# Patient Record
Sex: Female | Born: 1950 | Race: White | Hispanic: No | Marital: Married | Smoking: Former smoker
Health system: Southern US, Community
[De-identification: ages and names within clinical notes are randomized; demographics above are authoritative.]

## PROBLEM LIST (undated history)

## (undated) DIAGNOSIS — M8580 Other specified disorders of bone density and structure, unspecified site: Secondary | ICD-10-CM

## (undated) DIAGNOSIS — D069 Carcinoma in situ of cervix, unspecified: Secondary | ICD-10-CM

## (undated) DIAGNOSIS — I10 Essential (primary) hypertension: Secondary | ICD-10-CM

## (undated) DIAGNOSIS — C801 Malignant (primary) neoplasm, unspecified: Secondary | ICD-10-CM

## (undated) DIAGNOSIS — I6529 Occlusion and stenosis of unspecified carotid artery: Secondary | ICD-10-CM

## (undated) DIAGNOSIS — J449 Chronic obstructive pulmonary disease, unspecified: Secondary | ICD-10-CM

## (undated) DIAGNOSIS — K219 Gastro-esophageal reflux disease without esophagitis: Secondary | ICD-10-CM

## (undated) DIAGNOSIS — Z7982 Long term (current) use of aspirin: Secondary | ICD-10-CM

## (undated) DIAGNOSIS — Z7902 Long term (current) use of antithrombotics/antiplatelets: Secondary | ICD-10-CM

## (undated) DIAGNOSIS — I5189 Other ill-defined heart diseases: Secondary | ICD-10-CM

## (undated) DIAGNOSIS — F419 Anxiety disorder, unspecified: Secondary | ICD-10-CM

## (undated) DIAGNOSIS — I2510 Atherosclerotic heart disease of native coronary artery without angina pectoris: Secondary | ICD-10-CM

## (undated) DIAGNOSIS — D509 Iron deficiency anemia, unspecified: Secondary | ICD-10-CM

## (undated) DIAGNOSIS — K635 Polyp of colon: Secondary | ICD-10-CM

## (undated) DIAGNOSIS — E785 Hyperlipidemia, unspecified: Secondary | ICD-10-CM

## (undated) DIAGNOSIS — J431 Panlobular emphysema: Secondary | ICD-10-CM

## (undated) DIAGNOSIS — E538 Deficiency of other specified B group vitamins: Secondary | ICD-10-CM

## (undated) DIAGNOSIS — Z720 Tobacco use: Secondary | ICD-10-CM

## (undated) DIAGNOSIS — I779 Disorder of arteries and arterioles, unspecified: Secondary | ICD-10-CM

## (undated) DIAGNOSIS — E042 Nontoxic multinodular goiter: Secondary | ICD-10-CM

## (undated) DIAGNOSIS — C55 Malignant neoplasm of uterus, part unspecified: Secondary | ICD-10-CM

## (undated) DIAGNOSIS — I6522 Occlusion and stenosis of left carotid artery: Secondary | ICD-10-CM

## (undated) DIAGNOSIS — E559 Vitamin D deficiency, unspecified: Secondary | ICD-10-CM

## (undated) DIAGNOSIS — K760 Fatty (change of) liver, not elsewhere classified: Secondary | ICD-10-CM

## (undated) HISTORY — PX: ABDOMINAL HYSTERECTOMY: SHX81

---

## 1978-11-01 DIAGNOSIS — C801 Malignant (primary) neoplasm, unspecified: Secondary | ICD-10-CM

## 1978-11-01 HISTORY — DX: Malignant (primary) neoplasm, unspecified: C80.1

## 1989-03-02 HISTORY — PX: LAPAROSCOPIC OVARIAN: SHX5906

## 2005-12-02 ENCOUNTER — Ambulatory Visit: Payer: Self-pay | Admitting: Internal Medicine

## 2006-12-16 ENCOUNTER — Ambulatory Visit: Payer: Self-pay | Admitting: Internal Medicine

## 2007-07-08 ENCOUNTER — Ambulatory Visit: Payer: Self-pay | Admitting: Gastroenterology

## 2007-12-19 ENCOUNTER — Ambulatory Visit: Payer: Self-pay | Admitting: Internal Medicine

## 2009-01-03 ENCOUNTER — Ambulatory Visit: Payer: Self-pay | Admitting: Internal Medicine

## 2010-01-07 ENCOUNTER — Ambulatory Visit: Payer: Self-pay | Admitting: Internal Medicine

## 2011-01-15 ENCOUNTER — Ambulatory Visit: Payer: Self-pay | Admitting: Internal Medicine

## 2011-01-20 ENCOUNTER — Ambulatory Visit: Payer: Self-pay | Admitting: Internal Medicine

## 2012-01-26 ENCOUNTER — Ambulatory Visit: Payer: Self-pay | Admitting: Internal Medicine

## 2012-01-29 ENCOUNTER — Ambulatory Visit: Payer: Self-pay | Admitting: Internal Medicine

## 2013-02-01 ENCOUNTER — Ambulatory Visit: Payer: Self-pay | Admitting: Internal Medicine

## 2014-01-16 ENCOUNTER — Ambulatory Visit: Payer: Self-pay | Admitting: Internal Medicine

## 2014-07-09 ENCOUNTER — Other Ambulatory Visit: Payer: Self-pay

## 2014-07-09 DIAGNOSIS — Z1231 Encounter for screening mammogram for malignant neoplasm of breast: Secondary | ICD-10-CM

## 2015-01-30 ENCOUNTER — Ambulatory Visit
Admission: RE | Admit: 2015-01-30 | Discharge: 2015-01-30 | Disposition: A | Discharge status: Home / Self Care | Payer: 59 | Source: Ambulatory Visit | Attending: Internal Medicine | Admitting: Internal Medicine

## 2015-01-30 DIAGNOSIS — Z1231 Encounter for screening mammogram for malignant neoplasm of breast: Secondary | ICD-10-CM | POA: Diagnosis not present

## 2015-01-30 HISTORY — DX: Malignant (primary) neoplasm, unspecified: C80.1

## 2015-09-26 DIAGNOSIS — Z86010 Personal history of colonic polyps: Secondary | ICD-10-CM | POA: Insufficient documentation

## 2015-10-02 ENCOUNTER — Other Ambulatory Visit: Payer: Self-pay | Admitting: Internal Medicine

## 2015-10-02 DIAGNOSIS — Z1231 Encounter for screening mammogram for malignant neoplasm of breast: Secondary | ICD-10-CM

## 2016-02-04 ENCOUNTER — Ambulatory Visit: Payer: 59

## 2016-02-04 ENCOUNTER — Ambulatory Visit
Admission: RE | Admit: 2016-02-04 | Discharge: 2016-02-04 | Disposition: A | Discharge status: Home / Self Care | Payer: 59 | Source: Ambulatory Visit | Attending: Internal Medicine | Admitting: Internal Medicine

## 2016-02-04 DIAGNOSIS — Z1231 Encounter for screening mammogram for malignant neoplasm of breast: Secondary | ICD-10-CM | POA: Insufficient documentation

## 2016-02-20 DIAGNOSIS — R072 Precordial pain: Secondary | ICD-10-CM | POA: Insufficient documentation

## 2017-03-15 ENCOUNTER — Other Ambulatory Visit: Payer: Self-pay | Admitting: Internal Medicine

## 2017-03-15 DIAGNOSIS — Z1231 Encounter for screening mammogram for malignant neoplasm of breast: Secondary | ICD-10-CM

## 2017-04-29 ENCOUNTER — Ambulatory Visit
Admission: RE | Admit: 2017-04-29 | Discharge: 2017-04-29 | Disposition: A | Discharge status: Home / Self Care | Payer: 59 | Source: Ambulatory Visit | Attending: Internal Medicine | Admitting: Internal Medicine

## 2017-04-29 DIAGNOSIS — Z1231 Encounter for screening mammogram for malignant neoplasm of breast: Secondary | ICD-10-CM | POA: Diagnosis present

## 2017-09-10 ENCOUNTER — Other Ambulatory Visit: Payer: Self-pay

## 2017-09-10 ENCOUNTER — Emergency Department (HOSPITAL_COMMUNITY)
Admission: EM | Admit: 2017-09-10 | Discharge: 2017-09-11 | Disposition: A | Discharge status: Home / Self Care | Payer: 59 | Attending: Emergency Medicine | Admitting: Emergency Medicine

## 2017-09-10 ENCOUNTER — Encounter (HOSPITAL_COMMUNITY): Payer: Self-pay | Admitting: Emergency Medicine

## 2017-09-10 DIAGNOSIS — I10 Essential (primary) hypertension: Secondary | ICD-10-CM | POA: Diagnosis not present

## 2017-09-10 DIAGNOSIS — R1013 Epigastric pain: Secondary | ICD-10-CM | POA: Diagnosis present

## 2017-09-10 DIAGNOSIS — Z79899 Other long term (current) drug therapy: Secondary | ICD-10-CM | POA: Diagnosis not present

## 2017-09-10 DIAGNOSIS — F17210 Nicotine dependence, cigarettes, uncomplicated: Secondary | ICD-10-CM | POA: Insufficient documentation

## 2017-09-10 DIAGNOSIS — Z8542 Personal history of malignant neoplasm of other parts of uterus: Secondary | ICD-10-CM | POA: Insufficient documentation

## 2017-09-10 HISTORY — DX: Essential (primary) hypertension: I10

## 2017-09-10 HISTORY — DX: Anxiety disorder, unspecified: F41.9

## 2017-09-10 HISTORY — DX: Gastro-esophageal reflux disease without esophagitis: K21.9

## 2017-09-10 LAB — CBC WITH DIFFERENTIAL/PLATELET
Basophils Absolute: 0 10*3/uL (ref 0.0–0.1)
Basophils Relative: 0 %
EOS ABS: 0.1 10*3/uL (ref 0.0–0.7)
EOS PCT: 0 %
HEMATOCRIT: 40.7 % (ref 36.0–46.0)
Hemoglobin: 14 g/dL (ref 12.0–15.0)
LYMPHS ABS: 3.8 10*3/uL (ref 0.7–4.0)
Lymphocytes Relative: 28 %
MCH: 31.3 pg (ref 26.0–34.0)
MCHC: 34.4 g/dL (ref 30.0–36.0)
MCV: 91.1 fL (ref 78.0–100.0)
MONO ABS: 0.7 10*3/uL (ref 0.1–1.0)
MONOS PCT: 5 %
Neutro Abs: 9 10*3/uL — ABNORMAL HIGH (ref 1.7–7.7)
Neutrophils Relative %: 67 %
Platelets: 222 10*3/uL (ref 150–400)
RBC: 4.47 MIL/uL (ref 3.87–5.11)
RDW: 12.8 % (ref 11.5–15.5)
WBC: 13.6 10*3/uL — AB (ref 4.0–10.5)

## 2017-09-10 LAB — COMPREHENSIVE METABOLIC PANEL
ALT: 18 U/L (ref 0–44)
AST: 24 U/L (ref 15–41)
Albumin: 3.8 g/dL (ref 3.5–5.0)
Alkaline Phosphatase: 60 U/L (ref 38–126)
Anion gap: 10 (ref 5–15)
BUN: 12 mg/dL (ref 8–23)
CHLORIDE: 100 mmol/L (ref 98–111)
CO2: 24 mmol/L (ref 22–32)
CREATININE: 0.89 mg/dL (ref 0.44–1.00)
Calcium: 8.9 mg/dL (ref 8.9–10.3)
GFR calc Af Amer: >60 mL/min (ref >60)
GLUCOSE: 162 mg/dL — AB (ref 70–99)
Potassium: 3.6 mmol/L (ref 3.5–5.1)
Sodium: 134 mmol/L — ABNORMAL LOW (ref 135–145)
Total Bilirubin: 0.5 mg/dL (ref 0.3–1.2)
Total Protein: 6.9 g/dL (ref 6.5–8.1)

## 2017-09-10 LAB — LIPASE, BLOOD: Lipase: 27 U/L (ref 11–51)

## 2017-09-10 NOTE — ED Triage Notes (Signed)
Pt c/o upper bilateral abd pain since 1800 today, denies n/v/d, last BM 09/10/17

## 2017-09-11 NOTE — Discharge Instructions (Addendum)

## 2017-09-11 NOTE — ED Notes (Signed)
Water and Ginger Ale provided

## 2017-09-11 NOTE — ED Provider Notes (Signed)
[facility] EMERGENCY DEPARTMENT Provider Note   CSN: [id number] Arrival date & time: 09/10/17  2234     History   Chief Complaint Chief Complaint  Patient presents with  . Abdominal Pain    Pt seen with NP student, I performed history/physical/documentation HPI Kaitlin Miller is a 67 y.o. female.  The history is provided by the patient and the spouse.  Abdominal Pain   This is a new problem. The current episode started 6 to 12 hours ago. The problem occurs constantly. The problem has been gradually improving. The pain is located in the epigastric region. The pain is moderate. Pertinent negatives include fever, diarrhea, hematochezia, melena and vomiting. The symptoms are aggravated by palpation. Nothing relieves the symptoms.  Patient presents with abdominal pain.  She reports gradual onset of upper abdominal pain and then started to radiate throughout abdomen.  It is now improving.  Denies nausea/vomiting/diarrhea.  She has never had this. No history of recent GI bleed.  She does not use NSAIDs. He has otherwise been well  Past Medical History:  Diagnosis Date  . Acid reflux   . Anxiety   . 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 Hypertension     There are no active problems to display for this patient.   Past Surgical History:  Procedure Laterality Date  . LAPAROSCOPIC OVARIAN  1991     OB History   None      Home Medications    Prior to Admission medications   Medication Sig Start Date End Date Taking? Authorizing Provider  bisoprolol-hydrochlorothiazide (ZIAC) 5-6.25 MG tablet Take 1 tablet by mouth daily.   Yes [provider]  Cholecalciferol (VITAMIN D) 2000 units CAPS Take 1 capsule by mouth every morning.   Yes [provider]  omeprazole (PRILOSEC) 20 MG capsule Take 20 mg by mouth every morning.   Yes [provider]  phentermine (ADIPEX-P) 37.5 MG tablet Take 37.5 mg by mouth daily before breakfast.   Yes [provider]  venlafaxine XR (EFFEXOR-XR) 150 MG 24 hr capsule Take 150 mg by mouth daily with breakfast.   Yes [provider]    Family History Family History  Problem Relation Age of Onset  . Breast cancer Sister 70    Social History Social History   Tobacco Use  . Smoking status: Current Every Day Smoker    Packs/day: 1.00    Years: 25.00    Pack years: 25.00    Types: Cigarettes  . Smokeless tobacco: Never Used  Substance Use Topics  . Alcohol use: Not Currently  . Drug use: Not Currently     Allergies   Patient has no known allergies.   Review of Systems Review of Systems  Constitutional: Negative for fever.  Respiratory: Negative for shortness of breath.   Cardiovascular: Negative for chest pain.  Gastrointestinal: Positive for abdominal pain. Negative for blood in stool, diarrhea, hematochezia, melena and vomiting.  All other systems reviewed and are negative.    Physical Exam Updated Vital Signs BP (!) 156/71 (BP Location: Right Arm)   Pulse 65   Temp 97.7 F (36.5 C) (Oral)   Resp 14   Ht 1.524 m (5')   Wt 63.5 kg (140 lb)   SpO2 95%   BMI 27.34 kg/m   Physical Exam  CONSTITUTIONAL: Well developed/well nourished HEAD: Normocephalic/atraumatic EYES: EOMI/PERRL, no icterus ENMT: Mucous membranes moist NECK: supple no meningeal signs SPINE/BACK:entire spine nontender CV: S1/S2 noted, no murmurs/rubs/gallops  noted LUNGS: Lungs are clear to auscultation bilaterally, no apparent distress ABDOMEN: soft, nontender, no rebound or guarding, bowel sounds noted throughout abdomen GU:no cva tenderness NEURO: Pt is awake/alert/appropriate, moves all extremitiesx4.  No facial droop.   EXTREMITIES: pulses normal/equal, full ROM SKIN: warm, color normal PSYCH: no abnormalities of mood noted, alert and oriented to situation  ED Treatments / Results  Labs (all labs ordered are listed, but only abnormal results are displayed) Labs Reviewed  COMPREHENSIVE  METABOLIC PANEL - Abnormal; Notable for the following components:      Result Value   Sodium 134 (*)    Glucose, Bld 162 (*)    All other components within normal limits  CBC WITH DIFFERENTIAL/PLATELET - Abnormal; Notable for the following components:   WBC 13.6 (*)    Neutro Abs 9.0 (*)    All other components within normal limits  LIPASE, BLOOD    EKG EKG Interpretation  Date/Time:  Saturday September 11 2017 00:10:54 EDT Ventricular Rate:  67 PR Interval:    QRS Duration: 97 QT Interval:  411 QTC Calculation: 434 R Axis:   61 Text Interpretation:  Sinus rhythm Anteroseptal infarct, old No previous ECGs available Confirmed by Ripley Fraise [phone]) on 09/11/2017 12:18:31 AM   Radiology No results found.  Procedures Procedures   Medications Ordered in ED Medications - No data to display   Initial Impression / Assessment and Plan / ED Course  I have reviewed the triage vital signs and the nursing notes.  Pertinent labs  results that were available during my care of the patient were reviewed by me and considered in my medical decision making (see chart for details).     Patient presented for epigastric abdominal pain that was improving by the time of our evaluation.  She continued to improve the emergency department without any medications.  She was asked multiple times and she declined pain meds.  She was taking p.o. fluids.  She reported very mild pain on final evaluation. I gave the patient the option of further monitoring, next day ultrasound for cholelithiasis, or just follow up with PCP next week.  She elected to follow-up with her PCP next week which seems reasonable.  She had an elevated white count, but no other concerning findings.  EKG was unremarkable She is awake alert, no distress.  Is resting comfortably.  Patient is appropriate for d/c home.  I doubt acute abdominal emergency at this time.  We discussed strict ER return precautions including worsened abdominal  pain, fever >100F with repetitive vomiting over next  12 hours Final Clinical Impressions(s) / ED Diagnoses   Final diagnoses:  Epigastric pain    ED Discharge Orders    None       Ripley Fraise, MD 09/11/17 [phone]

## 2018-04-14 ENCOUNTER — Other Ambulatory Visit: Payer: Self-pay | Admitting: Internal Medicine

## 2018-04-14 DIAGNOSIS — Z1231 Encounter for screening mammogram for malignant neoplasm of breast: Secondary | ICD-10-CM

## 2018-05-11 ENCOUNTER — Other Ambulatory Visit: Payer: Self-pay

## 2018-05-11 ENCOUNTER — Ambulatory Visit
Admission: RE | Admit: 2018-05-11 | Discharge: 2018-05-11 | Disposition: A | Discharge status: Home / Self Care | Payer: 59 | Source: Ambulatory Visit | Attending: Internal Medicine | Admitting: Internal Medicine

## 2018-05-11 DIAGNOSIS — Z1231 Encounter for screening mammogram for malignant neoplasm of breast: Secondary | ICD-10-CM | POA: Insufficient documentation

## 2018-11-08 ENCOUNTER — Encounter (INDEPENDENT_AMBULATORY_CARE_PROVIDER_SITE_OTHER): Payer: Self-pay

## 2018-11-08 ENCOUNTER — Ambulatory Visit (INDEPENDENT_AMBULATORY_CARE_PROVIDER_SITE_OTHER): Payer: 59 | Admitting: Vascular Surgery

## 2018-11-08 ENCOUNTER — Other Ambulatory Visit: Payer: Self-pay

## 2018-11-08 ENCOUNTER — Encounter (INDEPENDENT_AMBULATORY_CARE_PROVIDER_SITE_OTHER): Payer: Self-pay | Admitting: Vascular Surgery

## 2018-11-08 VITALS — BP 158/81 | HR 84 | Resp 16 | Ht 60.0 in | Wt 140.0 lb

## 2018-11-08 DIAGNOSIS — E042 Nontoxic multinodular goiter: Secondary | ICD-10-CM | POA: Insufficient documentation

## 2018-11-08 DIAGNOSIS — K219 Gastro-esophageal reflux disease without esophagitis: Secondary | ICD-10-CM | POA: Diagnosis not present

## 2018-11-08 DIAGNOSIS — E782 Mixed hyperlipidemia: Secondary | ICD-10-CM | POA: Insufficient documentation

## 2018-11-08 DIAGNOSIS — M8580 Other specified disorders of bone density and structure, unspecified site: Secondary | ICD-10-CM | POA: Insufficient documentation

## 2018-11-08 DIAGNOSIS — E559 Vitamin D deficiency, unspecified: Secondary | ICD-10-CM | POA: Insufficient documentation

## 2018-11-08 DIAGNOSIS — I6523 Occlusion and stenosis of bilateral carotid arteries: Secondary | ICD-10-CM | POA: Diagnosis not present

## 2018-11-08 DIAGNOSIS — I6529 Occlusion and stenosis of unspecified carotid artery: Secondary | ICD-10-CM | POA: Insufficient documentation

## 2018-11-08 DIAGNOSIS — Z87828 Personal history of other (healed) physical injury and trauma: Secondary | ICD-10-CM | POA: Insufficient documentation

## 2018-11-08 DIAGNOSIS — I10 Essential (primary) hypertension: Secondary | ICD-10-CM

## 2018-11-08 DIAGNOSIS — J431 Panlobular emphysema: Secondary | ICD-10-CM | POA: Insufficient documentation

## 2018-11-08 DIAGNOSIS — D099 Carcinoma in situ, unspecified: Secondary | ICD-10-CM | POA: Insufficient documentation

## 2018-11-08 NOTE — Assessment & Plan Note (Addendum)
Continue PPI as already ordered, this medication has been reviewed and there are no changes at this time. Avoidence of caffeine and alcohol Moderate elevation of the head of the bed  

## 2018-11-08 NOTE — Assessment & Plan Note (Signed)
Her carotid duplex shows velocities in the upper end of the 1 to 49% range bilaterally with elevated velocities in the left external carotid artery of greater than 60%. We discussed the pathophysiology and natural history of carotid disease today.  The patient has relatively mild internal carotid artery stenosis bilaterally.  Her left external carotid artery stenosis is not clinically significant but may explain the bruit.  I would recommend she do an 81 mg aspirin daily.  We discussed risk factor modification such as blood pressure control and tobacco cessation.  We discussed the low risk of stroke at this level and the reason and rationale for no intervention unless the carotid disease becomes greater than 70%.  At this level of disease, annual follow-up is appropriate so we will see her in 1 year.

## 2018-11-08 NOTE — Progress Notes (Signed)
Patient ID: Kaitlin Miller, female   DOB: 1950-07-29, 68 y.o.   MRN: [id number]  Chief Complaint  Patient presents with  . New Patient (Initial Visit)    ref [facility] for carotid stenosis    HPI Kaitlin Miller is a 68 y.o. female.  I am asked to see the patient by Dr. Doy Hutching for evaluation of a carotid bruit and carotid artery stenosis.  The patient reports no symptoms.  She was in her usual state of health when her primary care physician noted a bruit on his routine physical exam.  Astutely, he ordered a carotid duplex.  The patient had not had any TIA or stroke symptoms. Specifically, the patient denies amaurosis fugax, speech or swallowing difficulties, or arm or leg weakness or numbness.  Her carotid duplex shows velocities in the upper end of the 1 to 49% range bilaterally with elevated velocities in the left external carotid artery of greater than 60%.   Past Medical History:  Diagnosis Date  . Acid reflux   . Anxiety   . 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 Hypertension     Past Surgical History:  Procedure Laterality Date  . LAPAROSCOPIC OVARIAN  1991    Family History  Problem Relation Age of Onset  . Breast cancer Sister 22  No bleeding or clotting disorders No aneurysms or autoimmune diseases  Social History Social History   Tobacco Use  . Smoking status: Current Every Day Smoker    Packs/day: 1.00    Years: 25.00    Pack years: 25.00    Types: Cigarettes  . Smokeless tobacco: Never Used  Substance Use Topics  . Alcohol use: Not Currently  . Drug use: Not Currently  Married and husband accompanies today  No Known Allergies  Current Outpatient Medications  Medication Sig Dispense Refill  . Cholecalciferol (VITAMIN D) 125 MCG (5000 UT) CAPS Take 1 capsule by mouth every morning.     . hydrALAZINE (APRESOLINE) 50 MG tablet TAKE 1.5 TABLETS BY MOUTH 2 TIMES DAILY    . hydrochlorothiazide (HYDRODIURIL) 25 MG tablet Take 25 mg by mouth daily.    Marland Kitchen  omeprazole (PRILOSEC) 20 MG capsule Take 20 mg by mouth every morning.    . potassium chloride SA (K-DUR) 20 MEQ tablet Take by mouth.    . venlafaxine XR (EFFEXOR-XR) 150 MG 24 hr capsule Take 150 mg by mouth daily with breakfast.    . bisoprolol-hydrochlorothiazide (ZIAC) 5-6.25 MG tablet Take 1 tablet by mouth daily.    . phentermine (ADIPEX-P) 37.5 MG tablet Take 37.5 mg by mouth daily before breakfast.     No current facility-administered medications for this visit.       REVIEW OF SYSTEMS (Negative unless checked)  Constitutional: [] Weight loss  [] Fever  [] Chills Cardiac: [] Chest pain   [] Chest pressure   [] Palpitations   [] Shortness of breath when laying flat   [] Shortness of breath at rest   [] Shortness of breath with exertion. Vascular:  [] Pain in legs with walking   [] Pain in legs at rest   [] Pain in legs when laying flat   [] Claudication   [] Pain in feet when walking  [] Pain in feet at rest  [] Pain in feet when laying flat   [] History of DVT   [] Phlebitis   [] Swelling in legs   [] Varicose veins   [] Non-healing ulcers Pulmonary:   [] Uses home oxygen   [] Productive cough   [] Hemoptysis   [] Wheeze  [] COPD   [] Asthma  Neurologic:  [] Dizziness  [] Blackouts   [] Seizures   [] History of stroke   [] History of TIA  [] Aphasia   [] Temporary blindness   [] Dysphagia   [] Weakness or numbness in arms   [] Weakness or numbness in legs Musculoskeletal:  [] Arthritis   [] Joint swelling   [] Joint pain   [] Low back pain Hematologic:  [] Easy bruising  [] Easy bleeding   [] Hypercoagulable state   [] Anemic  [] Hepatitis Gastrointestinal:  [] Blood in stool   [] Vomiting blood  [x] Gastroesophageal reflux/heartburn   [] Abdominal pain Genitourinary:  [] Chronic kidney disease   [] Difficult urination  [] Frequent urination  [] Burning with urination   [] Hematuria Skin:  [] Rashes   [] Ulcers   [] Wounds Psychological:  [x] History of anxiety   []  History of major depression.    Physical Exam BP (!) 158/81 (BP Location:  Right Arm)   Pulse 84   Resp 16   Ht 5' (1.524 m)   Wt 140 lb (63.5 kg)   BMI 27.34 kg/m  Gen:  WD/WN, NAD.  Appears younger than stated age Head: [place]/AT, No temporalis wasting. Ear/Nose/Throat: Hearing grossly intact, nares w/o erythema or drainage, oropharynx w/o Erythema/Exudate Eyes: Conjunctiva clear, sclera non-icteric  Neck: trachea midline.  Left carotid bruit is present Pulmonary:  Good air movement, clear to auscultation bilaterally.  Cardiac: RRR, normal S1, S2 Vascular:  Vessel Right Left  Radial Palpable Palpable                          PT Palpable Palpable  DP Palpable Palpable   Gastrointestinal: soft, non-tender/non-distended.  Musculoskeletal: M/S 5/5 throughout.  Extremities without ischemic changes.  No deformity or atrophy.  No significant lower extremity edema. Neurologic: Sensation grossly intact in extremities.  Symmetrical.  Speech is fluent. Motor exam as listed above. Psychiatric: Judgment intact, Mood & affect appropriate for pt's clinical situation. Dermatologic: No rashes or ulcers noted.  No cellulitis or open wounds.   Radiology No results found.  Labs No results found for this or any previous visit (from the past 2160 hour(s)).  Assessment/Plan:  Benign essential hypertension blood pressure control important in reducing the progression of atherosclerotic disease. On appropriate oral medications.   GERD (gastroesophageal reflux disease) Continue PPI as already ordered, this medication has been reviewed and there are no changes at this time. Avoidence of caffeine and alcohol Moderate elevation of the head of the bed   Carotid stenosis Her carotid duplex shows velocities in the upper end of the 1 to 49% range bilaterally with elevated velocities in the left external carotid artery of greater than 60%. We discussed the pathophysiology and natural history of carotid disease today.  The patient has relatively mild internal carotid artery  stenosis bilaterally.  Her left external carotid artery stenosis is not clinically significant but may explain the bruit.  I would recommend she do an 81 mg aspirin daily.  We discussed risk factor modification such as blood pressure control and tobacco cessation.  We discussed the low risk of stroke at this level and the reason and rationale for no intervention unless the carotid disease becomes greater than 70%.  At this level of disease, annual follow-up is appropriate so we will see her in 1 year.      Leotis Pain 11/08/2018, 4:41 PM   This note was created with Dragon medical transcription system.  Any errors from dictation are unintentional.

## 2018-11-08 NOTE — Patient Instructions (Signed)
Carotid Artery Disease  The carotid arteries are arteries on both sides of the neck. They carry blood to the brain, face, and neck. Carotid artery disease happens when these arteries become smaller (narrow) or get blocked. If these arteries become smaller or get blocked, you are more likely to have a stroke or a warning stroke (transient ischemic attack). Follow these instructions at home:  Take over-the-counter and prescription medicines only as told by your doctor.  Make sure you understand all instructions about your medicines. Do not stop taking your medicines without talking to your doctor first.  Follow your doctor's diet instructions. It is important to follow a healthy diet. ? Eat foods that include plenty of: ? Fresh fruits. ? Vegetables. ? Lean meats. ? Avoid these foods: ? Foods that are high in fat. ? Foods that are high in salt (sodium). ? Foods that are fried. ? Foods that are processed. ? Foods that have few good nutrients (poor nutritional value).  Keep a healthy weight.  Stay active. Get at least 30 minutes of activity every day.  Do not smoke.  Limit alcohol use to: ? No more than 2 drinks a day for men. ? No more than 1 drink a day for women who are not pregnant.  Do not use illegal drugs.  Keep all follow-up visits as told by your doctor. This is important. Contact a doctor if: Get help right away if:  You have any symptoms of stroke or TIA. The acronym BEFAST is an easy way to remember the main warning signs of stroke. ? B = Balance problems. Signs include dizziness, sudden trouble walking, or loss of balance ? E = Eye problems. This includes trouble seeing or a sudden change in vision. ? F = Face changes. This includes sudden weakness or numbness of the face, or the face or eyelid drooping to one side. ? A = Arm weakness or numbness. This happens suddenly and usually on one side of the body. ? S = Speech problems. This includes trouble speaking or  trouble understanding. ? T = Time. Time to call 911 or seek emergency care. Do not wait to see if symptoms go away. Make note of the time your symptoms started.  Other signs of stroke may include: ? A sudden, severe headache with no known cause. ? Feeling sick to your stomach (nauseous) or throwing up (vomiting). ? Seizure. Call your local emergency services (911 in [place] Do notdrive yourself to the clinic or hospital. Summary  The carotid arteries are arteries on both sides of the neck.  If these arteries get smaller or get blocked, you are more likely to have a stroke or a warning stroke (transient ischemic attack).  Take over-the-counter and prescription medicines only as told by your doctor.  Keep all follow-up visits as told by your doctor. This is important. This information is not intended to replace advice given to you by your health care provider. Make sure you discuss any questions you have with your health care provider. Document Released: 02/03/2012 Document Revised: 02/11/2017 Document Reviewed: 02/11/2017 Elsevier Patient Education  2020 [employer]  

## 2018-11-08 NOTE — Assessment & Plan Note (Signed)
blood pressure control important in reducing the progression of atherosclerotic disease. On appropriate oral medications.  

## 2019-03-28 ENCOUNTER — Other Ambulatory Visit: Payer: Self-pay | Admitting: Internal Medicine

## 2019-03-28 DIAGNOSIS — Z1231 Encounter for screening mammogram for malignant neoplasm of breast: Secondary | ICD-10-CM

## 2019-11-14 ENCOUNTER — Ambulatory Visit (INDEPENDENT_AMBULATORY_CARE_PROVIDER_SITE_OTHER): Payer: 59 | Admitting: Vascular Surgery

## 2019-11-14 ENCOUNTER — Encounter (INDEPENDENT_AMBULATORY_CARE_PROVIDER_SITE_OTHER): Payer: 59

## 2020-03-05 ENCOUNTER — Encounter (INDEPENDENT_AMBULATORY_CARE_PROVIDER_SITE_OTHER): Payer: Self-pay

## 2020-03-05 ENCOUNTER — Ambulatory Visit (INDEPENDENT_AMBULATORY_CARE_PROVIDER_SITE_OTHER): Payer: Self-pay | Admitting: Vascular Surgery

## 2020-03-19 ENCOUNTER — Ambulatory Visit
Admission: RE | Admit: 2020-03-19 | Discharge: 2020-03-19 | Disposition: A | Discharge status: Home / Self Care | Payer: Medicare HMO | Source: Ambulatory Visit | Attending: Internal Medicine | Admitting: Internal Medicine

## 2020-03-19 ENCOUNTER — Other Ambulatory Visit: Payer: Self-pay

## 2020-03-19 DIAGNOSIS — Z1231 Encounter for screening mammogram for malignant neoplasm of breast: Secondary | ICD-10-CM | POA: Diagnosis present

## 2020-04-01 ENCOUNTER — Other Ambulatory Visit (INDEPENDENT_AMBULATORY_CARE_PROVIDER_SITE_OTHER): Payer: Self-pay | Admitting: Vascular Surgery

## 2020-04-01 DIAGNOSIS — I779 Disorder of arteries and arterioles, unspecified: Secondary | ICD-10-CM

## 2020-04-02 ENCOUNTER — Ambulatory Visit (INDEPENDENT_AMBULATORY_CARE_PROVIDER_SITE_OTHER): Payer: Self-pay | Admitting: Vascular Surgery

## 2020-04-02 ENCOUNTER — Other Ambulatory Visit: Payer: Self-pay

## 2020-04-02 ENCOUNTER — Ambulatory Visit (INDEPENDENT_AMBULATORY_CARE_PROVIDER_SITE_OTHER): Payer: Medicare HMO

## 2020-04-02 DIAGNOSIS — I779 Disorder of arteries and arterioles, unspecified: Secondary | ICD-10-CM

## 2020-04-09 ENCOUNTER — Encounter (INDEPENDENT_AMBULATORY_CARE_PROVIDER_SITE_OTHER): Payer: Self-pay | Admitting: *Deleted

## 2020-04-09 ENCOUNTER — Other Ambulatory Visit (INDEPENDENT_AMBULATORY_CARE_PROVIDER_SITE_OTHER): Payer: Self-pay | Admitting: Vascular Surgery

## 2020-04-09 DIAGNOSIS — I6523 Occlusion and stenosis of bilateral carotid arteries: Secondary | ICD-10-CM

## 2020-10-08 ENCOUNTER — Encounter (INDEPENDENT_AMBULATORY_CARE_PROVIDER_SITE_OTHER): Payer: Self-pay | Admitting: Vascular Surgery

## 2020-10-08 ENCOUNTER — Ambulatory Visit (INDEPENDENT_AMBULATORY_CARE_PROVIDER_SITE_OTHER): Payer: Medicare HMO

## 2020-10-08 ENCOUNTER — Ambulatory Visit (INDEPENDENT_AMBULATORY_CARE_PROVIDER_SITE_OTHER): Payer: Medicare HMO | Admitting: Vascular Surgery

## 2020-10-08 ENCOUNTER — Other Ambulatory Visit: Payer: Self-pay

## 2020-10-08 VITALS — BP 148/77 | HR 80 | Resp 16 | Wt 139.8 lb

## 2020-10-08 DIAGNOSIS — E782 Mixed hyperlipidemia: Secondary | ICD-10-CM | POA: Diagnosis not present

## 2020-10-08 DIAGNOSIS — I10 Essential (primary) hypertension: Secondary | ICD-10-CM | POA: Diagnosis not present

## 2020-10-08 DIAGNOSIS — I6523 Occlusion and stenosis of bilateral carotid arteries: Secondary | ICD-10-CM

## 2020-10-08 DIAGNOSIS — K219 Gastro-esophageal reflux disease without esophagitis: Secondary | ICD-10-CM

## 2020-10-08 NOTE — Assessment & Plan Note (Signed)
Carotid duplex today reveals stable 40 to 59% right ICA stenosis and stable 60 to 79% left ICA stenosis without significant progression from previous study.  Continue current medical regimen.  Recheck in 6 months.

## 2020-10-08 NOTE — Progress Notes (Signed)
MRN : [id number]  Kaitlin Miller is a 70 y.o. (1950/11/02) female who presents with chief complaint of  Chief Complaint  Patient presents with   Follow-up    Ultrasound follow up  .  History of Present Illness: Patient returns today in follow up of her carotid disease.  She is doing well today without any complaints.  She denies any focal neurologic symptoms. Specifically, the patient denies amaurosis fugax, speech or swallowing difficulties, or arm or leg weakness or numbness. Carotid duplex today reveals stable 40 to 59% right ICA stenosis and stable 60 to 79% left ICA stenosis without significant progression from previous study.  Current Outpatient Medications  Medication Sig Dispense Refill   bisoprolol-hydrochlorothiazide (ZIAC) 5-6.25 MG tablet Take 1 tablet by mouth daily.     Cholecalciferol (VITAMIN D) 125 MCG (5000 UT) CAPS Take 1 capsule by mouth every morning.      hydrALAZINE (APRESOLINE) 50 MG tablet TAKE 1.5 TABLETS BY MOUTH 2 TIMES DAILY     hydrochlorothiazide (HYDRODIURIL) 25 MG tablet Take 25 mg by mouth daily.     omeprazole (PRILOSEC) 20 MG capsule Take 20 mg by mouth every morning.     phentermine (ADIPEX-P) 37.5 MG tablet Take 37.5 mg by mouth daily before breakfast.     venlafaxine XR (EFFEXOR-XR) 150 MG 24 hr capsule Take 150 mg by mouth daily with breakfast.     potassium chloride SA (K-DUR) 20 MEQ tablet Take by mouth.     No current facility-administered medications for this visit.    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   Hypertension     Past Surgical History:  Procedure Laterality Date   LAPAROSCOPIC OVARIAN  1991     Social History   Tobacco Use   Smoking status: Every Day    Packs/day: 1.00    Years: 25.00    Pack years: 25.00    Types: Cigarettes   Smokeless tobacco: Never  Vaping Use   Vaping Use: Never used  Substance Use Topics   Alcohol use: Not Currently   Drug  use: Not Currently      Family History  Problem Relation Age of Onset   Breast cancer Sister 63     No Known Allergies    REVIEW OF SYSTEMS (Negative unless checked)   Constitutional: '[]'$ Weight loss  '[]'$ Fever  '[]'$ Chills Cardiac: '[]'$ Chest pain   '[]'$ Chest pressure   '[]'$ Palpitations   '[]'$ Shortness of breath when laying flat   '[]'$ Shortness of breath at rest   '[]'$ Shortness of breath with exertion. Vascular:  '[]'$ Pain in legs with walking   '[]'$ Pain in legs at rest   '[]'$ Pain in legs when laying flat   '[]'$ Claudication   '[]'$ Pain in feet when walking  '[]'$ Pain in feet at rest  '[]'$ Pain in feet when laying flat   '[]'$ History of DVT   '[]'$ Phlebitis   '[]'$ Swelling in legs   '[]'$ Varicose veins   '[]'$ Non-healing ulcers Pulmonary:   '[]'$ Uses home oxygen   '[]'$ Productive cough   '[]'$ Hemoptysis   '[]'$ Wheeze  '[]'$ COPD   '[]'$ Asthma Neurologic:  '[]'$ Dizziness  '[]'$ Blackouts   '[]'$ Seizures   '[]'$ History of stroke   '[]'$ History of TIA  '[]'$ Aphasia   '[]'$ Temporary blindness   '[]'$ Dysphagia   '[]'$ Weakness or numbness in arms   '[]'$ Weakness or numbness in legs Musculoskeletal:  '[]'$ Arthritis   '[]'$ Joint swelling   '[]'$ Joint pain   '[]'$ Low back pain Hematologic:  '[]'$   Easy bruising  '[]'$ Easy bleeding   '[]'$ Hypercoagulable state   '[]'$ Anemic  '[]'$ Hepatitis Gastrointestinal:  '[]'$ Blood in stool   '[]'$ Vomiting blood  '[x]'$ Gastroesophageal reflux/heartburn   '[]'$ Abdominal pain Genitourinary:  '[]'$ Chronic kidney disease   '[]'$ Difficult urination  '[]'$ Frequent urination  '[]'$ Burning with urination   '[]'$ Hematuria Skin:  '[]'$ Rashes   '[]'$ Ulcers   '[]'$ Wounds Psychological:  '[x]'$ History of anxiety   '[]'$  History of major depression.  Physical Examination  BP (!) 148/77 (BP Location: Left Arm)   Pulse 80   Resp 16   Wt 139 lb 12.8 oz (63.4 kg)   BMI 27.30 kg/m  Gen:  WD/WN, NAD. Appears younger than stated age Head: [place]/AT, No temporalis wasting. Ear/Nose/Throat: Hearing grossly intact, nares w/o erythema or drainage Eyes: Conjunctiva clear. Sclera non-icteric Neck: Supple.  Trachea midline. Bilateral carotid  bruits. Pulmonary:  Good air movement, no use of accessory muscles.  Cardiac: RRR, no JVD Vascular:  Vessel Right Left  Radial Palpable Palpable           Musculoskeletal: M/S 5/5 throughout.  No deformity or atrophy. No edema. Neurologic: Sensation grossly intact in extremities.  Symmetrical.  Speech is fluent.  Psychiatric: Judgment intact, Mood & affect appropriate for pt's clinical situation. Dermatologic: No rashes or ulcers noted.  No cellulitis or open wounds.      Labs No results found for this or any previous visit (from the past 2160 hour(s)).  Radiology No results found.  Assessment/Plan Benign essential hypertension blood pressure control important in reducing the progression of atherosclerotic disease. On appropriate oral medications.     GERD (gastroesophageal reflux disease) Continue PPI as already ordered, this medication has been reviewed and there are no changes at this time. Avoidence of caffeine and alcohol Moderate elevation of the head of the bed   Carotid stenosis Carotid duplex today reveals stable 40 to 59% right ICA stenosis and stable 60 to 79% left ICA stenosis without significant progression from previous study.  Continue current medical regimen.  Recheck in 6 months.    Leotis Pain, MD  10/08/2020 4:00 PM    This note was created with Dragon medical transcription system.  Any errors from dictation are purely unintentional

## 2020-12-10 ENCOUNTER — Encounter: Payer: Self-pay | Admitting: Internal Medicine

## 2020-12-11 ENCOUNTER — Ambulatory Visit
Admission: RE | Admit: 2020-12-11 | Discharge: 2020-12-11 | Disposition: A | Discharge status: Home / Self Care | Payer: Medicare HMO | Attending: Internal Medicine | Admitting: Internal Medicine

## 2020-12-11 ENCOUNTER — Ambulatory Visit: Payer: Medicare HMO | Admitting: Certified Registered"

## 2020-12-11 ENCOUNTER — Encounter: Payer: Self-pay | Admitting: Internal Medicine

## 2020-12-11 ENCOUNTER — Encounter
Admission: RE | Disposition: A | Discharge status: Home / Self Care | Payer: Self-pay | Source: Home / Self Care | Attending: Internal Medicine

## 2020-12-11 ENCOUNTER — Other Ambulatory Visit: Payer: Self-pay

## 2020-12-11 DIAGNOSIS — K640 First degree hemorrhoids: Secondary | ICD-10-CM | POA: Diagnosis not present

## 2020-12-11 DIAGNOSIS — Z86010 Personal history of colonic polyps: Secondary | ICD-10-CM | POA: Diagnosis not present

## 2020-12-11 DIAGNOSIS — Z1211 Encounter for screening for malignant neoplasm of colon: Secondary | ICD-10-CM | POA: Insufficient documentation

## 2020-12-11 DIAGNOSIS — I10 Essential (primary) hypertension: Secondary | ICD-10-CM | POA: Insufficient documentation

## 2020-12-11 DIAGNOSIS — I739 Peripheral vascular disease, unspecified: Secondary | ICD-10-CM | POA: Insufficient documentation

## 2020-12-11 DIAGNOSIS — K5730 Diverticulosis of large intestine without perforation or abscess without bleeding: Secondary | ICD-10-CM | POA: Insufficient documentation

## 2020-12-11 DIAGNOSIS — K219 Gastro-esophageal reflux disease without esophagitis: Secondary | ICD-10-CM | POA: Diagnosis not present

## 2020-12-11 DIAGNOSIS — F17210 Nicotine dependence, cigarettes, uncomplicated: Secondary | ICD-10-CM | POA: Insufficient documentation

## 2020-12-11 HISTORY — PX: COLONOSCOPY WITH PROPOFOL: SHX5780

## 2020-12-11 SURGERY — COLONOSCOPY WITH PROPOFOL
Anesthesia: General

## 2020-12-11 MED ORDER — PROPOFOL 10 MG/ML IV BOLUS
INTRAVENOUS | Status: AC
  Filled 2020-12-11: qty 40

## 2020-12-11 MED ORDER — PROPOFOL 500 MG/50ML IV EMUL
INTRAVENOUS | Status: DC | PRN
  Administered 2020-12-11: 100 ug/kg/min via INTRAVENOUS

## 2020-12-11 MED ORDER — SODIUM CHLORIDE 0.9 % IV SOLN: INTRAVENOUS | Status: DC

## 2020-12-11 MED ORDER — PROPOFOL 10 MG/ML IV BOLUS
INTRAVENOUS | Status: DC | PRN
  Administered 2020-12-11: 60 mg via INTRAVENOUS

## 2020-12-11 MED ORDER — LIDOCAINE HCL (CARDIAC) PF 100 MG/5ML IV SOSY
PREFILLED_SYRINGE | INTRAVENOUS | Status: DC | PRN
  Administered 2020-12-11: 50 mg via INTRAVENOUS

## 2020-12-11 NOTE — Anesthesia Postprocedure Evaluation (Signed)
Anesthesia Post Note  Patient: Kaitlin Miller  Procedure(s) Performed: COLONOSCOPY WITH PROPOFOL  Patient location during evaluation: PACU Anesthesia Type: General Level of consciousness: awake and alert, oriented and patient cooperative Pain management: pain level controlled Vital Signs Assessment: post-procedure vital signs reviewed and stable Respiratory status: spontaneous breathing, nonlabored ventilation and respiratory function stable Cardiovascular status: blood pressure returned to baseline and stable Postop Assessment: adequate PO intake Anesthetic complications: no   No notable events documented.   Last Vitals:  Vitals:   12/11/20 1437 12/11/20 1447  BP: (!) 142/67 (!) 151/67  Pulse:    Resp:    Temp:    SpO2:      Last Pain:  Vitals:   12/11/20 1447  TempSrc:   PainSc: 0-No pain                 Darrin Nipper

## 2020-12-11 NOTE — H&P (Signed)
Outpatient short stay form Pre-procedure 12/11/2020 12:39 PM Warden Buffa K. Alice Reichert, M.D.  Primary Physician: Fulton Reek, M.D.  Reason for visit:  Personal history of adenomatous colon polyp in 2017.  History of present illness:                            Patient presents for colonoscopy for a personal hx of colon polyps. The patient denies abdominal pain, abnormal weight loss or rectal bleeding.     No current facility-administered medications for this encounter.  Medications Prior to Admission  Medication Sig Dispense Refill Last Dose   Multiple Vitamin (MULTIVITAMIN) tablet Take 1 tablet by mouth daily.      rosuvastatin (CRESTOR) 10 MG tablet Take 10 mg by mouth daily.      bisoprolol-hydrochlorothiazide (ZIAC) 5-6.25 MG tablet Take 1 tablet by mouth daily.      Cholecalciferol (VITAMIN D) 125 MCG (5000 UT) CAPS Take 1 capsule by mouth every morning.       hydrALAZINE (APRESOLINE) 50 MG tablet TAKE 1.5 TABLETS BY MOUTH 2 TIMES DAILY      hydrochlorothiazide (HYDRODIURIL) 25 MG tablet Take 25 mg by mouth daily.      omeprazole (PRILOSEC) 20 MG capsule Take 20 mg by mouth every morning.      phentermine (ADIPEX-P) 37.5 MG tablet Take 37.5 mg by mouth daily before breakfast.      potassium chloride SA (K-DUR) 20 MEQ tablet Take by mouth.      venlafaxine XR (EFFEXOR-XR) 150 MG 24 hr capsule Take 150 mg by mouth daily with breakfast.        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    Physical Exam  Gen: Alert, oriented. Appears stated age.  HEENT: /AT. PERRLA. Lungs: CTA, no wheezes. CV: RR nl S1, S2. Abd: soft, benign, no masses. BS+ Ext: No edema. Pulses 2+    Planned procedures: Proceed with colonoscopy. The patient understands the nature of the planned procedure, indications, risks, alternatives and potential complications  including but not limited to bleeding, infection, perforation, damage to internal organs and possible oversedation/side effects from anesthesia. The patient agrees and gives consent to proceed.  Please refer to procedure notes for findings, recommendations and patient disposition/instructions.     Kara Mierzejewski K. Alice Reichert, M.D. Gastroenterology 12/11/2020  12:39 PM

## 2020-12-11 NOTE — Transfer of Care (Signed)
Immediate Anesthesia Transfer of Care Note  Patient: Kaitlin Miller  Procedure(s) Performed: COLONOSCOPY WITH PROPOFOL  Patient Location: PACU and Endoscopy Unit  Anesthesia Type:General  Level of Consciousness: drowsy  Airway & Oxygen Therapy: Patient Spontanous Breathing  Post-op Assessment: Report given to RN and Post -op Vital signs reviewed and stable  Post vital signs: Reviewed and stable  Last Vitals:  Vitals Value Taken Time  BP 103/62 12/11/20 1417  Temp 36.1 C 12/11/20 1417  Pulse 79 12/11/20 1417  Resp 15 12/11/20 1417  SpO2 93 % 12/11/20 1417  Vitals shown include unvalidated device data.  Last Pain:  Vitals:   12/11/20 1417  TempSrc: Temporal  PainSc: Asleep         Complications: No notable events documented.

## 2020-12-11 NOTE — Anesthesia Preprocedure Evaluation (Signed)
Anesthesia Evaluation  Patient identified by MRN, date of birth, ID band Patient awake    Reviewed: Allergy & Precautions, NPO status , Patient's Chart, lab work & pertinent test results  History of Anesthesia Complications Negative for: history of anesthetic complications  Airway Mallampati: III   Neck ROM: Full    Dental   Crowns :   Pulmonary Current Smoker (1/3 ppd) and Patient abstained from smoking.,    Pulmonary exam normal breath sounds clear to auscultation       Cardiovascular hypertension, + Peripheral Vascular Disease (carotid stenosis)  Normal cardiovascular exam Rhythm:Regular Rate:Normal     Neuro/Psych PSYCHIATRIC DISORDERS Anxiety Depression negative neurological ROS     GI/Hepatic GERD  ,  Endo/Other  negative endocrine ROS  Renal/GU negative Renal ROS     Musculoskeletal   Abdominal   Peds  Hematology Uterine cancer   Anesthesia Other Findings   Reproductive/Obstetrics                             Anesthesia Physical Anesthesia Plan  ASA: 2  Anesthesia Plan: General   Post-op Pain Management:    Induction: Intravenous  PONV Risk Score and Plan: 2 and Propofol infusion, TIVA and Treatment may vary due to age or medical condition  Airway Management Planned: Natural Airway  Additional Equipment:   Intra-op Plan:   Post-operative Plan:   Informed Consent: I have reviewed the patients History and Physical, chart, labs and discussed the procedure including the risks, benefits and alternatives for the proposed anesthesia with the patient or authorized representative who has indicated his/her understanding and acceptance.       Plan Discussed with: CRNA  Anesthesia Plan Comments:         Anesthesia Quick Evaluation

## 2020-12-11 NOTE — Op Note (Signed)
[facility] Gastroenterology Patient Name: Kaitlin Miller Procedure Date: 12/11/2020 1:53 PM MRN: [id number] Account #: [id number] Date of Birth: [birth year] Admit Type: Outpatient Age: 70 Room: [facility] ENDO ROOM 2 Gender: Female Note Status: Finalized Instrument Name: Jasper Riling [id number] Procedure:             Colonoscopy Indications:           High risk colon cancer surveillance: Personal history                         of non-advanced adenoma Providers:             Benay Pike. Alice Reichert MD, MD Referring MD:          Leonie Douglas. Doy Hutching, MD (Referring MD) Medicines:             Propofol per Anesthesia Complications:         No immediate complications. Procedure:             Pre-Anesthesia Assessment:                        - The risks and benefits of the procedure and the                         sedation options and risks were discussed with the                         patient. All questions were answered and informed                         consent was obtained.                        - Patient identification and proposed procedure were                         verified prior to the procedure by the nurse. The                         procedure was verified in the procedure room.                        - ASA Grade Assessment: III - A patient with severe                         systemic disease.                        - After reviewing the risks and benefits, the patient                         was deemed in satisfactory condition to undergo the                         procedure.                        After obtaining informed consent, the colonoscope was                         passed under  direct vision. Throughout the procedure,                         the patient's blood pressure, pulse, and oxygen                         saturations were monitored continuously. The                         Colonoscope was introduced through the anus and                          advanced to the the cecum, identified by appendiceal                         orifice and ileocecal valve. The colonoscopy was                         performed without difficulty. The patient tolerated                         the procedure well. The quality of the bowel                         preparation was adequate. The ileocecal valve,                         appendiceal orifice, and rectum were photographed. Findings:      The perianal and digital rectal examinations were normal. Pertinent       negatives include normal sphincter tone and no palpable rectal lesions.      Non-bleeding internal hemorrhoids were found during retroflexion. The       hemorrhoids were Grade I (internal hemorrhoids that do not prolapse).      Many small-mouthed diverticula were found in the left colon.      The exam was otherwise without abnormality. Impression:            - Non-bleeding internal hemorrhoids.                        - Diverticulosis in the left colon.                        - The examination was otherwise normal.                        - No specimens collected. Recommendation:        - Patient has a contact number available for                         emergencies. The signs and symptoms of potential                         delayed complications were discussed with the patient.                         Return to normal activities tomorrow. Written  discharge instructions were provided to the patient.                        - Resume previous diet.                        - Continue present medications.                        - No repeat colonoscopy due to current age (88 years                         or older) and the absence of colonic polyps.                        - You do NOT require further colon cancer screening                         measures (Annual stool testing (i.e. hemoccult, FIT,                         cologuard), sigmoidoscopy, colonoscopy or CT                          colonography). You should share this recommendation                         with your Primary Care provider.                        - Return to GI office PRN.                        - The findings and recommendations were discussed with                         the patient. Procedure Code(s):     --- Professional ---                        C7893, Colorectal cancer screening; colonoscopy on                         individual at high risk Diagnosis Code(s):     --- Professional ---                        K57.30, Diverticulosis of large intestine without                         perforation or abscess without bleeding                        K64.0, First degree hemorrhoids                        Z86.010, Personal history of colonic polyps CPT copyright 2019 American Medical Association. All rights reserved. The codes documented in this report are preliminary and upon coder review may  be revised to meet current compliance requirements. Efrain Sella MD, MD 12/11/2020 2:15:36 PM This report has been signed electronically. Number of Addenda:  0 Note Initiated On: 12/11/2020 1:53 PM Scope Withdrawal Time: 0 hours 6 minutes 20 seconds  Total Procedure Duration: 0 hours 10 minutes 51 seconds  Estimated Blood Loss:  Estimated blood loss: none.      [facility]

## 2020-12-11 NOTE — Interval H&P Note (Signed)
History and Physical Interval Note:  12/11/2020 12:39 PM  Kaitlin Miller  has presented today for surgery, with the diagnosis of Hx of polyps z86.010.  The various methods of treatment have been discussed with the patient and family. After consideration of risks, benefits and other options for treatment, the patient has consented to  Procedure(s): COLONOSCOPY WITH PROPOFOL (N/A) as a surgical intervention.  The patient's history has been reviewed, patient examined, no change in status, stable for surgery.  I have reviewed the patient's chart and labs.  Questions were answered to the patient's satisfaction.     [place], [place]

## 2020-12-12 ENCOUNTER — Encounter: Payer: Self-pay | Admitting: Internal Medicine

## 2021-02-27 ENCOUNTER — Other Ambulatory Visit: Payer: Self-pay | Admitting: Internal Medicine

## 2021-02-27 DIAGNOSIS — Z1231 Encounter for screening mammogram for malignant neoplasm of breast: Secondary | ICD-10-CM

## 2021-03-25 ENCOUNTER — Ambulatory Visit
Admission: RE | Admit: 2021-03-25 | Discharge: 2021-03-25 | Disposition: A | Discharge status: Home / Self Care | Payer: Medicare HMO | Source: Ambulatory Visit | Attending: Internal Medicine | Admitting: Internal Medicine

## 2021-03-25 ENCOUNTER — Other Ambulatory Visit: Payer: Self-pay

## 2021-03-25 DIAGNOSIS — Z1231 Encounter for screening mammogram for malignant neoplasm of breast: Secondary | ICD-10-CM | POA: Diagnosis present

## 2021-04-08 ENCOUNTER — Ambulatory Visit (INDEPENDENT_AMBULATORY_CARE_PROVIDER_SITE_OTHER): Payer: Medicare HMO | Admitting: Vascular Surgery

## 2021-04-08 ENCOUNTER — Encounter (INDEPENDENT_AMBULATORY_CARE_PROVIDER_SITE_OTHER): Payer: Self-pay | Admitting: Vascular Surgery

## 2021-04-08 ENCOUNTER — Telehealth (INDEPENDENT_AMBULATORY_CARE_PROVIDER_SITE_OTHER): Payer: Self-pay | Admitting: *Deleted

## 2021-04-08 ENCOUNTER — Ambulatory Visit (INDEPENDENT_AMBULATORY_CARE_PROVIDER_SITE_OTHER): Payer: Medicare HMO

## 2021-04-08 ENCOUNTER — Other Ambulatory Visit: Payer: Self-pay

## 2021-04-08 VITALS — BP 150/72 | HR 86 | Resp 16 | Wt 141.0 lb

## 2021-04-08 DIAGNOSIS — K219 Gastro-esophageal reflux disease without esophagitis: Secondary | ICD-10-CM | POA: Diagnosis not present

## 2021-04-08 DIAGNOSIS — I6523 Occlusion and stenosis of bilateral carotid arteries: Secondary | ICD-10-CM | POA: Diagnosis not present

## 2021-04-08 DIAGNOSIS — E782 Mixed hyperlipidemia: Secondary | ICD-10-CM | POA: Diagnosis not present

## 2021-04-08 DIAGNOSIS — I10 Essential (primary) hypertension: Secondary | ICD-10-CM | POA: Diagnosis not present

## 2021-04-08 NOTE — Telephone Encounter (Signed)
Per insurance the patient CTA Carotid was approved. Approval #[id number] Good 04/08/21-10/05/21

## 2021-04-08 NOTE — Assessment & Plan Note (Signed)
Carotid duplex today shows progression of disease bilaterally into the 60 to 79% range on the right and now into the 80 to 99% range on the left.  The patient remains asymptomatic with respect to the carotid stenosis.  However, the patient has now progressed and has a lesion the is >70%.  Patient should undergo CT angiography of the carotid arteries to define the degree of stenosis of the internal carotid arteries bilaterally and the anatomic suitability for surgery vs. intervention.  If the patient does indeed need surgery cardiac clearance will be required, once cleared the patient will be scheduled for surgery.  The risks, benefits and alternative therapies were reviewed in detail with the patient.  All questions were answered.  The patient agrees to proceed with imaging.  Continue antiplatelet therapy as prescribed. Continue management of CAD, HTN and Hyperlipidemia. Healthy heart diet, encouraged exercise at least 4 times per week.

## 2021-04-08 NOTE — Assessment & Plan Note (Signed)
lipid control important in reducing the progression of atherosclerotic disease. Continue statin therapy  

## 2021-04-08 NOTE — Progress Notes (Signed)
MRN : [id number]  Kaitlin Miller is a 71 y.o. ([birth year]) female who presents with chief complaint of  Chief Complaint  Patient presents with   Follow-up    Ultrasound follow up  .  History of Present Illness: Patient returns in follow-up of her carotid disease.  She says she is doing well today.  She denies any focal neurologic symptoms or major changes since her last visit. Specifically, the patient denies amaurosis fugax, speech or swallowing difficulties, or arm or leg weakness or numbness.  Her duplex today does show progression of carotid stenosis now into the 60 to 79% range on the right and in the 80 to 99% range on the left.  Current Outpatient Medications  Medication Sig Dispense Refill   bisoprolol-hydrochlorothiazide (ZIAC) 5-6.25 MG tablet Take 1 tablet by mouth daily.     Cholecalciferol (VITAMIN D) 125 MCG (5000 UT) CAPS Take 1 capsule by mouth every morning.      hydrALAZINE (APRESOLINE) 50 MG tablet TAKE 1.5 TABLETS BY MOUTH 2 TIMES DAILY     hydrochlorothiazide (HYDRODIURIL) 25 MG tablet Take 25 mg by mouth daily.     meloxicam (MOBIC) 7.5 MG tablet Take 7.5 mg by mouth daily.     Multiple Vitamin (MULTIVITAMIN) tablet Take 1 tablet by mouth daily.     omeprazole (PRILOSEC) 20 MG capsule Take 20 mg by mouth every morning.     phentermine (ADIPEX-P) 37.5 MG tablet Take 37.5 mg by mouth daily before breakfast.     potassium chloride SA (K-DUR) 20 MEQ tablet Take by mouth.     rosuvastatin (CRESTOR) 10 MG tablet Take 10 mg by mouth daily.     venlafaxine XR (EFFEXOR-XR) 150 MG 24 hr capsule Take 150 mg by mouth daily with breakfast.     No current facility-administered medications for this visit.    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   Hypertension     Past Surgical History:  Procedure Laterality Date   ABDOMINAL HYSTERECTOMY     COLONOSCOPY WITH PROPOFOL N/A 12/11/2020   Procedure:  COLONOSCOPY WITH PROPOFOL;  Surgeon: [place], Benay Pike, MD;  Location: [facility] ENDOSCOPY;  Service: Gastroenterology;  Laterality: N/A;   LAPAROSCOPIC OVARIAN  1991     Social History   Tobacco Use   Smoking status: Every Day    Packs/day: 0.50    Years: 50.00    Pack years: 25.00    Types: Cigarettes   Smokeless tobacco: Never  Vaping Use   Vaping Use: Never used  Substance Use Topics   Alcohol use: Not Currently   Drug use: Never      Family History  Problem Relation Age of Onset   Breast cancer Sister 41     No Known Allergies   REVIEW OF SYSTEMS (Negative unless checked)  Constitutional: [] Weight loss  [] Fever  [] Chills Cardiac: [] Chest pain   [] Chest pressure   [] Palpitations   [] Shortness of breath when laying flat   [] Shortness of breath at rest   [] Shortness of breath with exertion. Vascular:  [] Pain in legs with walking   [] Pain in legs at rest   [] Pain in legs when laying flat   [] Claudication   [] Pain in feet when walking  [] Pain in feet at rest  [] Pain in feet when laying flat   [] History of DVT   [] Phlebitis   [] Swelling in legs   [] Varicose  veins   [] Non-healing ulcers Pulmonary:   [] Uses home oxygen   [] Productive cough   [] Hemoptysis   [] Wheeze  [] COPD   [] Asthma Neurologic:  [] Dizziness  [] Blackouts   [] Seizures   [] History of stroke   [] History of TIA  [] Aphasia   [] Temporary blindness   [] Dysphagia   [] Weakness or numbness in arms   [] Weakness or numbness in legs Musculoskeletal:  [] Arthritis   [] Joint swelling   [] Joint pain   [] Low back pain Hematologic:  [] Easy bruising  [] Easy bleeding   [] Hypercoagulable state   [] Anemic  [] Hepatitis Gastrointestinal:  [] Blood in stool   [] Vomiting blood  [x] Gastroesophageal reflux/heartburn   [] Difficulty swallowing. Genitourinary:  [] Chronic kidney disease   [] Difficult urination  [] Frequent urination  [] Burning with urination   [] Blood in urine Skin:  [] Rashes   [] Ulcers   [] Wounds Psychological:  [x] History of  anxiety   []  History of major depression.  Physical Examination  Vitals:   04/08/21 1441  BP: (!) 150/72  Pulse: 86  Resp: 16  Weight: 141 lb (64 kg)   Body mass index is 27.54 kg/m. Gen:  WD/WN, NAD.  Appears younger than stated age Head: [place]/AT, No temporalis wasting. Ear/Nose/Throat: Hearing grossly intact, nares w/o erythema or drainage, trachea midline Eyes: Conjunctiva clear. Sclera non-icteric Neck: Supple.  Bilateral carotid bruit  Pulmonary:  Good air movement, equal and clear to auscultation bilaterally.  Cardiac: RRR, No JVD Vascular:  Vessel Right Left  Radial Palpable Palpable       Musculoskeletal: M/S 5/5 throughout.  No deformity or atrophy.  No edema. Neurologic: CN 2-12 intact. Sensation grossly intact in extremities.  Symmetrical.  Speech is fluent. Motor exam as listed above. Psychiatric: Judgment intact, Mood & affect appropriate for pt's clinical situation. Dermatologic: No rashes or ulcers noted.  No cellulitis or open wounds.     CBC Lab Results  Component Value Date   WBC 13.6 (H) 09/10/2017   HGB 14.0 09/10/2017   HCT 40.7 09/10/2017   MCV 91.1 09/10/2017   PLT 222 09/10/2017    BMET    Component Value Date/Time   NA 134 (L) 09/10/2017 2331   K 3.6 09/10/2017 2331   CL 100 09/10/2017 2331   CO2 24 09/10/2017 2331   GLUCOSE 162 (H) 09/10/2017 2331   BUN 12 09/10/2017 2331   CREATININE 0.89 09/10/2017 2331   CALCIUM 8.9 09/10/2017 2331   GFRNONAA >60 09/10/2017 2331   GFRAA >60 09/10/2017 2331   CrCl cannot be calculated (Patient's most recent lab result is older than the maximum 21 days allowed.).  COAG No results found for: INR, PROTIME  Radiology MM 3D SCREEN BREAST BILATERAL  Result Date: 03/26/2021 CLINICAL DATA:  Screening. EXAM: DIGITAL SCREENING BILATERAL MAMMOGRAM WITH TOMOSYNTHESIS AND CAD TECHNIQUE: Bilateral screening digital craniocaudal and mediolateral oblique mammograms were obtained. Bilateral screening digital  breast tomosynthesis was performed. The images were evaluated with computer-aided detection. COMPARISON:  Previous exam(s). ACR Breast Density Category b: There are scattered areas of fibroglandular density. FINDINGS: There are no findings suspicious for malignancy. There are multiple round and oval masses which have waxed and waned since prior exam. These are most consistent with benign cysts. IMPRESSION: No mammographic evidence of malignancy. A result letter of this screening mammogram will be mailed directly to the patient. RECOMMENDATION: Screening mammogram in one year. (Code:SM-B-01Y) BI-RADS CATEGORY  2: Benign. Electronically Signed   By: Valentino Saxon M.D.   On: 03/26/2021 08:09    Assessment/Plan Benign essential hypertension  blood pressure control important in reducing the progression of atherosclerotic disease. On appropriate oral medications.     GERD (gastroesophageal reflux disease) Continue PPI as already ordered, this medication has been reviewed and there are no changes at this time. Avoidence of caffeine and alcohol Moderate elevation of the head of the bed   No problem-specific Assessment & Plan notes found for this encounter.    Leotis Pain, MD  04/08/2021 2:57 PM    This note was created with Dragon medical transcription system.  Any errors from dictation are purely unintentional

## 2021-04-14 ENCOUNTER — Telehealth (INDEPENDENT_AMBULATORY_CARE_PROVIDER_SITE_OTHER): Payer: Self-pay | Admitting: Vascular Surgery

## 2021-04-14 NOTE — Telephone Encounter (Signed)
Kaitlin Miller would like for Dr. Bunnie Domino nurse to give her a call regarding her last appointment with him.

## 2021-04-14 NOTE — Telephone Encounter (Signed)
Patient reach out about getting her CT schedule. Patient was giving radiology number and will contact the office to schedule her appointment with Dr Lucky Cowboy to go over the CT results.

## 2021-04-24 ENCOUNTER — Other Ambulatory Visit: Payer: Self-pay

## 2021-04-24 ENCOUNTER — Ambulatory Visit
Admission: RE | Admit: 2021-04-24 | Discharge: 2021-04-24 | Disposition: A | Discharge status: Home / Self Care | Payer: Medicare HMO | Source: Ambulatory Visit | Attending: Vascular Surgery | Admitting: Vascular Surgery

## 2021-04-24 DIAGNOSIS — I6523 Occlusion and stenosis of bilateral carotid arteries: Secondary | ICD-10-CM | POA: Insufficient documentation

## 2021-04-24 LAB — POCT I-STAT CREATININE: Creatinine, Ser: 0.8 mg/dL (ref 0.44–1.00)

## 2021-04-24 MED ORDER — IOHEXOL 350 MG/ML SOLN
75.0000 mL | Freq: Once | INTRAVENOUS | Status: AC | PRN
Start: 2021-04-24 — End: 2021-04-24
  Administered 2021-04-24: 75 mL via INTRAVENOUS

## 2021-04-29 ENCOUNTER — Other Ambulatory Visit: Payer: Self-pay

## 2021-04-29 ENCOUNTER — Telehealth (INDEPENDENT_AMBULATORY_CARE_PROVIDER_SITE_OTHER): Payer: Self-pay | Admitting: Vascular Surgery

## 2021-04-29 ENCOUNTER — Ambulatory Visit (INDEPENDENT_AMBULATORY_CARE_PROVIDER_SITE_OTHER): Payer: Medicare HMO | Admitting: Vascular Surgery

## 2021-04-29 VITALS — BP 181/77 | HR 88 | Ht 60.0 in | Wt 143.0 lb

## 2021-04-29 DIAGNOSIS — K219 Gastro-esophageal reflux disease without esophagitis: Secondary | ICD-10-CM

## 2021-04-29 DIAGNOSIS — I10 Essential (primary) hypertension: Secondary | ICD-10-CM | POA: Diagnosis not present

## 2021-04-29 DIAGNOSIS — E782 Mixed hyperlipidemia: Secondary | ICD-10-CM | POA: Diagnosis not present

## 2021-04-29 DIAGNOSIS — I6523 Occlusion and stenosis of bilateral carotid arteries: Secondary | ICD-10-CM

## 2021-04-29 MED ORDER — CLOPIDOGREL BISULFATE 75 MG PO TABS: 75.0000 mg | ORAL_TABLET | Freq: Every day | ORAL | 6 refills | Status: DC

## 2021-04-29 NOTE — Patient Instructions (Signed)
Carotid Angioplasty With Stent, Care After This sheet gives you information about how to care for yourself after your procedure. Your health care provider may also give you more specific instructions. If you have problems or questions, contact your health care provider. What can I expect after the procedure? After the procedure, it is common to have: Bruising at the catheter site. This usually goes away within 1-2 weeks. A small amount of blood or clear fluid coming from your incision. A small amount of blood collecting underneath your skin (hematoma) around the catheter site. This may form a lump that can be sore and tender. This usually lasts for 1-2 weeks. Follow these instructions at home: Medicines Take over-the-counter and prescription medicines only as told by your health care provider. Blood thinners may be prescribed after your procedure to prevent blood clots. If you were prescribed an antibiotic medicine, take it as told by your health care provider. Do not stop using the antibiotic even if you start to feel better. Ask your health care provider if the medicine prescribed to you requires you to avoid driving or using heavy machinery. Incision care  Keep your incision area clean and dry. Follow instructions from your health care provider about how to take care of your incision. Make sure you: Wash your hands with soap and water before and after you change your bandage (dressing). If soap and water are not available, use hand sanitizer. Change your dressing as told by your health care provider. Do not rub the site because this may cause bleeding. Leave stitches (sutures), skin glue, or adhesive strips in place. These skin closures may need to stay in place for 2 weeks or longer. If adhesive strip edges start to loosen and curl up, you may trim the loose edges. Do not remove adhesive strips completely unless your health care provider tells you to do that. Check your incision area every  day for signs of infection. Check for: More redness, swelling, or pain. More fluid or blood. Warmth. Pus or a bad smell. Activity Return to your normal activities as told by your health care provider. Ask your health care provider what activities are safe for you. Do not lift anything that is heavier than 10 lb (4.5 kg), or the limit that you are told, until your health care provider says that it is safe. Avoid sexual activity until your health care provider says that this is safe for you. Eating and drinking  Follow instructions from your health care provider about eating or drinking restrictions. Drink enough fluid to keep your urine pale yellow. Eat a heart-healthy diet. This includes foods like fresh fruits and vegetables, whole grains, low-fat dairy products, and low-fat (lean) meats. Avoid foods that are: High in salt, saturated fat, or sugar. Canned or highly processed. [place] Pro. Lifestyle If you drink alcohol: Limit how much you use to: 0-1 drink a day for women who are not pregnant. 0-2 drinks a day for men. Be aware of how much alcohol is in your drink. In the [place] one drink equals one 12 oz bottle of beer (355 mL), one 5 oz glass of wine (148 mL), or one 1 oz glass of hard liquor (44 mL). Do not use any products that contain nicotine or tobacco, such as cigarettes, e-cigarettes, and chewing tobacco. If you need help quitting, ask your health care provider. Work with your health care provider to keep your blood pressure under control. Maintain a healthy weight. General instructions Do not take baths, swim, or  use a hot tub until your health care provider approves. To keep the incision from bleeding, avoid straining while having a bowel movement. Tell all health care providers who provide care for you that you have a stent. Keep all follow-up visits as told by your health care provider. This is important. Contact a health care provider if you have: More redness, swelling, or  pain around your incision. More fluid or blood coming from your incision. An incision area that feels warm to the touch. Pus or a bad smell coming from your incision. A lump caused by a hematoma that does not go away after 2 weeks. A fever. Get help right away if you have: Difficulty breathing. Chest pain. Any symptoms of a stroke. Certain factors may put you at risk for a stroke even after this procedure. These may include diabetes, chronic lung disease, chronic kidney disease, previous history of a stroke, and being 4 years of age or older. "BE FAST" is an easy way to remember the main warning signs of a stroke: B - Balance. Signs are dizziness, sudden trouble walking, or loss of balance. E - Eyes. Signs are trouble seeing or a sudden change in vision. F - Face. Signs are sudden weakness or numbness of the face, or the face or eyelid drooping on one side. A - Arms. Signs are weakness or numbness in an arm. This happens suddenly and usually on one side of the body. S - Speech. Signs are sudden trouble speaking, slurred speech, or trouble understanding what people say. T - Time. Time to call emergency services. Write down what time symptoms started. Other signs of a stroke, such as: A sudden, severe headache with no known cause. Nausea or vomiting. Seizure. A hematoma that is quickly getting larger. Pain in the area where your stent was placed. Bleeding from your incision, and it does not stop after you hold pressure on it for a few minutes. These symptoms may represent a serious problem that is an emergency. Do not wait to see if the symptoms will go away. Get medical help right away. Call your local emergency services (911 in the [place] Do not drive yourself to the hospital. Summary After the procedure, it is common to have bruising or a small amount of blood or fluid at the incision site where the catheter was inserted. Take over-the-counter and prescription medicines only as told by  your health care provider. Return to your normal activities as told by your health care provider. Ask your health care provider what activities are safe for you. Make sure you know which symptoms should prompt you to get medical help right away. This information is not intended to replace advice given to you by your health care provider. Make sure you discuss any questions you have with your health care provider. Document Revised: 04/30/2020 Document Reviewed: 04/30/2020 Elsevier Patient Education  [place].

## 2021-04-29 NOTE — Assessment & Plan Note (Signed)
lipid control important in reducing the progression of atherosclerotic disease. Continue statin therapy  

## 2021-04-29 NOTE — Telephone Encounter (Signed)
Ms. Butsch called and wanted to know if the surgery could be held off until the 13th instead of Monday.

## 2021-04-29 NOTE — Assessment & Plan Note (Signed)
She has undergone a CT angiogram of the neck which I have independently reviewed.  The official report is of an at least 50% right carotid stenosis and at least 70% left carotid stenosis, but this would be a significant underrepresentation of the degree of stenosis based on my review.  I think the right side is more in the 60 to 65% range and the left side is clearly greater than 80% and probably closer to 90%.  The lesion is also fairly high on the left approaching C2 and may be difficult to access surgically. Given this finding, stroke risk reduction favors revascularization.  Given the high lesion and the difficult anatomy, carotid artery stenting is likely a preferable option.  Her arch seems reasonably favorable.  She has some tortuosity but not dense calcification I think we can manage this.  Risks and benefits of carotid stenting were discussed with the patient and she is agreeable to proceed with left carotid stent placement.  We will prescribe Plavix and this can be started tomorrow.

## 2021-04-29 NOTE — Progress Notes (Signed)
MRN : [id number]  Kaitlin Miller is a 71 y.o. ([birth year]) female who presents with chief complaint of No chief complaint on file. Marland Kitchen  History of Present Illness: Patient returns in follow-up of her carotid disease.  Since her last visit, no new issues or problems.  She has undergone a CT angiogram of the neck which I have independently reviewed.  The official report is of an at least 50% right carotid stenosis and at least 70% left carotid stenosis, but this would be a significant underrepresentation of the degree of stenosis based on my review.  I think the right side is more in the 60 to 65% range and the left side is clearly greater than 80% and probably closer to 90%.  The lesion is also fairly high on the left approaching C2 and may be difficult to access surgically.  Current Outpatient Medications  Medication Sig Dispense Refill   bisoprolol-hydrochlorothiazide (ZIAC) 5-6.25 MG tablet Take 1 tablet by mouth daily.     Cholecalciferol (VITAMIN D) 125 MCG (5000 UT) CAPS Take 1 capsule by mouth every morning.      clopidogrel (PLAVIX) 75 MG tablet Take 1 tablet (75 mg total) by mouth daily. 30 tablet 6   hydrALAZINE (APRESOLINE) 50 MG tablet TAKE 1.5 TABLETS BY MOUTH 2 TIMES DAILY     hydrochlorothiazide (HYDRODIURIL) 25 MG tablet Take 25 mg by mouth daily.     Multiple Vitamin (MULTIVITAMIN) tablet Take 1 tablet by mouth daily.     omeprazole (PRILOSEC) 20 MG capsule Take 20 mg by mouth every morning.     rosuvastatin (CRESTOR) 10 MG tablet Take 10 mg by mouth daily.     venlafaxine XR (EFFEXOR-XR) 150 MG 24 hr capsule Take 150 mg by mouth daily with breakfast.     meloxicam (MOBIC) 7.5 MG tablet Take 7.5 mg by mouth daily. (Patient not taking: Reported on 04/29/2021)     phentermine (ADIPEX-P) 37.5 MG tablet Take 37.5 mg by mouth daily before breakfast. (Patient not taking: Reported on 04/29/2021)     potassium chloride SA (K-DUR) 20 MEQ tablet Take by mouth.     No current  facility-administered medications for this visit.    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   Hypertension     Past Surgical History:  Procedure Laterality Date   ABDOMINAL HYSTERECTOMY     COLONOSCOPY WITH PROPOFOL N/A 12/11/2020   Procedure: COLONOSCOPY WITH PROPOFOL;  Surgeon: Toledo, Benay Pike, MD;  Location: [facility] ENDOSCOPY;  Service: Gastroenterology;  Laterality: N/A;   LAPAROSCOPIC OVARIAN  1991     Social History   Tobacco Use   Smoking status: Every Day    Packs/day: 0.50    Years: 50.00    Pack years: 25.00    Types: Cigarettes   Smokeless tobacco: Never  Vaping Use   Vaping Use: Never used  Substance Use Topics   Alcohol use: Not Currently   Drug use: Never      Family History  Problem Relation Age of Onset   Breast cancer Sister 73  No bleeding or clotting disorders No aneurysms  No Known Allergies   REVIEW OF SYSTEMS (Negative unless checked)  Constitutional: [] Weight loss  [] Fever  [] Chills Cardiac: [] Chest pain   [] Chest pressure   [] Palpitations   [] Shortness of breath when laying flat   [] Shortness of breath at rest   [] Shortness of breath  with exertion. Vascular:  [] Pain in legs with walking   [] Pain in legs at rest   [] Pain in legs when laying flat   [] Claudication   [] Pain in feet when walking  [] Pain in feet at rest  [] Pain in feet when laying flat   [] History of DVT   [] Phlebitis   [] Swelling in legs   [] Varicose veins   [] Non-healing ulcers Pulmonary:   [] Uses home oxygen   [] Productive cough   [] Hemoptysis   [] Wheeze  [] COPD   [] Asthma Neurologic:  [] Dizziness  [] Blackouts   [] Seizures   [] History of stroke   [] History of TIA  [] Aphasia   [] Temporary blindness   [] Dysphagia   [] Weakness or numbness in arms   [] Weakness or numbness in legs Musculoskeletal:  [x] Arthritis   [] Joint swelling   [] Joint pain   [] Low back pain Hematologic:  [] Easy bruising  [] Easy bleeding    [] Hypercoagulable state   [] Anemic  [] Hepatitis Gastrointestinal:  [] Blood in stool   [] Vomiting blood  [] Gastroesophageal reflux/heartburn   [] Difficulty swallowing. Genitourinary:  [] Chronic kidney disease   [] Difficult urination  [] Frequent urination  [] Burning with urination   [] Blood in urine Skin:  [] Rashes   [] Ulcers   [] Wounds Psychological:  [x] History of anxiety   []  History of major depression.  Physical Examination  Vitals:   04/29/21 1426  BP: (!) 181/77  Pulse: 88  Weight: 143 lb (64.9 kg)  Height: 5' (1.524 m)   Body mass index is 27.93 kg/m. Gen:  WD/WN, NAD Head: [place]/AT, No temporalis wasting. Ear/Nose/Throat: Hearing grossly intact, nares w/o erythema or drainage, trachea midline Eyes: Conjunctiva clear. Sclera non-icteric Neck: Supple.  Bilateral carotid bruit  Pulmonary:  Good air movement, equal and clear to auscultation bilaterally.  Cardiac: RRR, No JVD Vascular:  Vessel Right Left  Radial Palpable Palpable           Musculoskeletal: M/S 5/5 throughout.  No deformity or atrophy.  No significant lower extremity edema. Neurologic: CN 2-12 intact. Sensation grossly intact in extremities.  Symmetrical.  Speech is fluent. Motor exam as listed above. Psychiatric: Judgment intact, Mood & affect appropriate for pt's clinical situation. Dermatologic: No rashes or ulcers noted.  No cellulitis or open wounds.     CBC Lab Results  Component Value Date   WBC 13.6 (H) 09/10/2017   HGB 14.0 09/10/2017   HCT 40.7 09/10/2017   MCV 91.1 09/10/2017   PLT 222 09/10/2017    BMET    Component Value Date/Time   NA 134 (L) 09/10/2017 2331   K 3.6 09/10/2017 2331   CL 100 09/10/2017 2331   CO2 24 09/10/2017 2331   GLUCOSE 162 (H) 09/10/2017 2331   BUN 12 09/10/2017 2331   CREATININE 0.80 04/24/2021 1534   CALCIUM 8.9 09/10/2017 2331   GFRNONAA >60 09/10/2017 2331   GFRAA >60 09/10/2017 2331   Estimated Creatinine Clearance: 53.5 mL/min (by C-G formula based  on SCr of 0.8 mg/dL).  COAG No results found for: INR, PROTIME  Radiology CT Angio Neck W/Cm &/Or Wo/Cm  Result Date: 04/24/2021 CLINICAL DATA:  Asymptomatic carotid artery stenosis EXAM: CT ANGIOGRAPHY NECK TECHNIQUE: Multidetector CT imaging of the neck was performed using the standard protocol during bolus administration of intravenous contrast. Multiplanar CT image reconstructions and MIPs were obtained to evaluate the vascular anatomy. Carotid stenosis measurements (when applicable) are obtained utilizing NASCET criteria, using the distal internal carotid diameter as the denominator. RADIATION DOSE REDUCTION: This exam was performed according to the departmental  dose-optimization program which includes automated exposure control, adjustment of the mA and/or kV according to patient size and/or use of iterative reconstruction technique. CONTRAST:  67mL OMNIPAQUE IOHEXOL 350 MG/ML SOLN COMPARISON:  None. FINDINGS: Aortic arch: Mild calcified plaque the arch. Mixed plaque at the patent great vessel origins. No high-grade proximal subclavian stenosis. Right carotid system: Common carotid is patent. Internal and external carotids are patent. There is calcified plaque at the bifurcation and noncalcified plaque at the ICA origin. This results in approximately 55% stenosis. Left carotid system: Common, internal, and external carotid arteries are patent. Mixed plaque at the bifurcation. There is marked stenosis with near occlusion at the ECA origin. Mixed but primarily noncalcified plaque along the proximal ICA causing at least 70% stenosis. Streak artifact through this area limits accuracy of measurement. Vertebral arteries: Patent and codominant.  No significant stenosis. Skeleton: Cervical spine degenerative changes, greatest at C5-C6. Other neck: Subcentimeter thyroid nodules. No ultrasound is recommended by current guidelines. Upper chest: Emphysema. IMPRESSION: Plaque at the right ICA origin causes  approximately 50% stenosis. Plaque at the proximal left ICA causes at least 70% stenosis. Emphysema. Electronically Signed   By: Macy Mis M.D.   On: 04/24/2021 15:58   VAS US CAROTID  Result Date: 04/14/2021 Carotid Arterial Duplex Study Patient Name:  TEKESHIA KLAHR  Date of Exam:   04/08/2021 Medical Rec #: [id number]         Accession #:    [id number] Date of Birth: [birth year]        Patient Gender: F Patient Age:   76 years Exam Location:  [facility] Procedure:      VAS US CAROTID Referring Phys: Leotis Pain --------------------------------------------------------------------------------  Indications:       Carotid artery disease. Comparison Study:  10/08/2020 Performing Technologist: Almira Coaster RVS  Examination Guidelines: A complete evaluation includes B-mode imaging, spectral Doppler, color Doppler, and power Doppler as needed of all accessible portions of each vessel. Bilateral testing is considered an integral part of a complete examination. Limited examinations for reoccurring indications may be performed as noted.  Right Carotid Findings: +----------+--------+--------+--------+------------------+--------+             PSV cm/s EDV cm/s Stenosis Plaque Description Comments  +----------+--------+--------+--------+------------------+--------+  CCA Prox   96       27                                             +----------+--------+--------+--------+------------------+--------+  CCA Mid    107      30                                             +----------+--------+--------+--------+------------------+--------+  CCA Distal 98       31                                             +----------+--------+--------+--------+------------------+--------+  ICA Prox   253      50                                             +----------+--------+--------+--------+------------------+--------+  ICA Mid    217      66                                              +----------+--------+--------+--------+------------------+--------+  ICA Distal 155      54                                             +----------+--------+--------+--------+------------------+--------+  ECA        198      31                                             +----------+--------+--------+--------+------------------+--------+ +----------+--------+-------+--------+-------------------+             PSV cm/s EDV cms Describe Arm Pressure (mmHG)  +----------+--------+-------+--------+-------------------+  Subclavian 194      0                                     +----------+--------+-------+--------+-------------------+ +---------+--------+--+--------+--+  Vertebral PSV cm/s 70 EDV cm/s 17  +---------+--------+--+--------+--+  Left Carotid Findings: +----------+--------+--------+--------+------------------+--------+             PSV cm/s EDV cm/s Stenosis Plaque Description Comments  +----------+--------+--------+--------+------------------+--------+  CCA Prox   79       17                                             +----------+--------+--------+--------+------------------+--------+  CCA Mid    70       18                                             +----------+--------+--------+--------+------------------+--------+  CCA Distal 52       19                                             +----------+--------+--------+--------+------------------+--------+  ICA Prox   334      108                                            +----------+--------+--------+--------+------------------+--------+  ICA Mid    263      75                                             +----------+--------+--------+--------+------------------+--------+  ICA Distal 113      32                                             +----------+--------+--------+--------+------------------+--------+  ECA        83       14                                             +----------+--------+--------+--------+------------------+--------+  +----------+--------+--------+--------+-------------------+             PSV cm/s EDV cm/s Describe Arm Pressure (mmHG)  +----------+--------+--------+--------+-------------------+  Subclavian 274                                             +----------+--------+--------+--------+-------------------+ +---------+--------+--+--------+--+  Vertebral PSV cm/s 69 EDV cm/s 22  +---------+--------+--+--------+--+   Summary: Right Carotid: Velocities in the right ICA are consistent with a 60-79%                stenosis. Left Carotid: Velocities and Ratios suggest a 80-99% stenosis on the Low End of               the Sclae in the Left ICA. Vertebrals:  Bilateral vertebral arteries demonstrate antegrade flow. Subclavians: Normal flow hemodynamics were seen in bilateral subclavian              arteries. *See table(s) above for measurements and observations.  Electronically signed by Leotis Pain MD on 04/14/2021 at 10:21:59 AM.    Final      Assessment/Plan Benign essential hypertension blood pressure control important in reducing the progression of atherosclerotic disease. On appropriate oral medications.     GERD (gastroesophageal reflux disease) Continue PPI as already ordered, this medication has been reviewed and there are no changes at this time. Avoidence of caffeine and alcohol Moderate elevation of the head of the bed   Hyperlipidemia, mixed lipid control important in reducing the progression of atherosclerotic disease. Continue statin therapy   Carotid stenosis She has undergone a CT angiogram of the neck which I have independently reviewed.  The official report is of an at least 50% right carotid stenosis and at least 70% left carotid stenosis, but this would be a significant underrepresentation of the degree of stenosis based on my review.  I think the right side is more in the 60 to 65% range and the left side is clearly greater than 80% and probably closer to 90%.  The lesion is also fairly high on  the left approaching C2 and may be difficult to access surgically. Given this finding, stroke risk reduction favors revascularization.  Given the high lesion and the difficult anatomy, carotid artery stenting is likely a preferable option.  Her arch seems reasonably favorable.  She has some tortuosity but not dense calcification I think we can manage this.  Risks and benefits of carotid stenting were discussed with the patient and she is agreeable to proceed with left carotid stent placement.  We will prescribe Plavix and this can be started tomorrow.    Leotis Pain, MD  04/29/2021 3:25 PM    This note was created with Dragon medical transcription system.  Any errors from dictation are purely unintentional

## 2021-04-29 NOTE — H&P (View-Only) (Signed)
MRN : [id number]  Kaitlin Miller is a 71 y.o. (June 15, 1950) female who presents with chief complaint of No chief complaint on file. Marland Kitchen  History of Present Illness: Patient returns in follow-up of her carotid disease.  Since her last visit, no new issues or problems.  She has undergone a CT angiogram of the neck which I have independently reviewed.  The official report is of an at least 50% right carotid stenosis and at least 70% left carotid stenosis, but this would be a significant underrepresentation of the degree of stenosis based on my review.  I think the right side is more in the 60 to 65% range and the left side is clearly greater than 80% and probably closer to 90%.  The lesion is also fairly high on the left approaching C2 and may be difficult to access surgically.  Current Outpatient Medications  Medication Sig Dispense Refill   bisoprolol-hydrochlorothiazide (ZIAC) 5-6.25 MG tablet Take 1 tablet by mouth daily.     Cholecalciferol (VITAMIN D) 125 MCG (5000 UT) CAPS Take 1 capsule by mouth every morning.      clopidogrel (PLAVIX) 75 MG tablet Take 1 tablet (75 mg total) by mouth daily. 30 tablet 6   hydrALAZINE (APRESOLINE) 50 MG tablet TAKE 1.5 TABLETS BY MOUTH 2 TIMES DAILY     hydrochlorothiazide (HYDRODIURIL) 25 MG tablet Take 25 mg by mouth daily.     Multiple Vitamin (MULTIVITAMIN) tablet Take 1 tablet by mouth daily.     omeprazole (PRILOSEC) 20 MG capsule Take 20 mg by mouth every morning.     rosuvastatin (CRESTOR) 10 MG tablet Take 10 mg by mouth daily.     venlafaxine XR (EFFEXOR-XR) 150 MG 24 hr capsule Take 150 mg by mouth daily with breakfast.     meloxicam (MOBIC) 7.5 MG tablet Take 7.5 mg by mouth daily. (Patient not taking: Reported on 04/29/2021)     phentermine (ADIPEX-P) 37.5 MG tablet Take 37.5 mg by mouth daily before breakfast. (Patient not taking: Reported on 04/29/2021)     potassium chloride SA (K-DUR) 20 MEQ tablet Take by mouth.     No current  facility-administered medications for this visit.    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   uterine   Hypertension     Past Surgical History:  Procedure Laterality Date   ABDOMINAL HYSTERECTOMY     COLONOSCOPY WITH PROPOFOL N/A 12/11/2020   Procedure: COLONOSCOPY WITH PROPOFOL;  Surgeon: Toledo, Benay Pike, MD;  Location: [facility] ENDOSCOPY;  Service: Gastroenterology;  Laterality: N/A;   LAPAROSCOPIC OVARIAN  1991     Social History   Tobacco Use   Smoking status: Every Day    Packs/day: 0.50    Years: 50.00    Pack years: 25.00    Types: Cigarettes   Smokeless tobacco: Never  Vaping Use   Vaping Use: Never used  Substance Use Topics   Alcohol use: Not Currently   Drug use: Never      Family History  Problem Relation Age of Onset   Breast cancer Sister 92  No bleeding or clotting disorders No aneurysms  No Known Allergies   REVIEW OF SYSTEMS (Negative unless checked)  Constitutional: [] Weight loss  [] Fever  [] Chills Cardiac: [] Chest pain   [] Chest pressure   [] Palpitations   [] Shortness of breath when laying flat   [] Shortness of breath at rest   [] Shortness of breath  with exertion. Vascular:  [] Pain in legs with walking   [] Pain in legs at rest   [] Pain in legs when laying flat   [] Claudication   [] Pain in feet when walking  [] Pain in feet at rest  [] Pain in feet when laying flat   [] History of DVT   [] Phlebitis   [] Swelling in legs   [] Varicose veins   [] Non-healing ulcers Pulmonary:   [] Uses home oxygen   [] Productive cough   [] Hemoptysis   [] Wheeze  [] COPD   [] Asthma Neurologic:  [] Dizziness  [] Blackouts   [] Seizures   [] History of stroke   [] History of TIA  [] Aphasia   [] Temporary blindness   [] Dysphagia   [] Weakness or numbness in arms   [] Weakness or numbness in legs Musculoskeletal:  [x] Arthritis   [] Joint swelling   [] Joint pain   [] Low back pain Hematologic:  [] Easy bruising  [] Easy bleeding    [] Hypercoagulable state   [] Anemic  [] Hepatitis Gastrointestinal:  [] Blood in stool   [] Vomiting blood  [] Gastroesophageal reflux/heartburn   [] Difficulty swallowing. Genitourinary:  [] Chronic kidney disease   [] Difficult urination  [] Frequent urination  [] Burning with urination   [] Blood in urine Skin:  [] Rashes   [] Ulcers   [] Wounds Psychological:  [x] History of anxiety   []  History of major depression.  Physical Examination  Vitals:   04/29/21 1426  BP: (!) 181/77  Pulse: 88  Weight: 143 lb (64.9 kg)  Height: 5' (1.524 m)   Body mass index is 27.93 kg/m. Gen:  WD/WN, NAD Head: Green/AT, No temporalis wasting. Ear/Nose/Throat: Hearing grossly intact, nares w/o erythema or drainage, trachea midline Eyes: Conjunctiva clear. Sclera non-icteric Neck: Supple.  Bilateral carotid bruit  Pulmonary:  Good air movement, equal and clear to auscultation bilaterally.  Cardiac: RRR, No JVD Vascular:  Vessel Right Left  Radial Palpable Palpable           Musculoskeletal: M/S 5/5 throughout.  No deformity or atrophy.  No significant lower extremity edema. Neurologic: CN 2-12 intact. Sensation grossly intact in extremities.  Symmetrical.  Speech is fluent. Motor exam as listed above. Psychiatric: Judgment intact, Mood & affect appropriate for pt's clinical situation. Dermatologic: No rashes or ulcers noted.  No cellulitis or open wounds.     CBC Lab Results  Component Value Date   WBC 13.6 (H) 09/10/2017   HGB 14.0 09/10/2017   HCT 40.7 09/10/2017   MCV 91.1 09/10/2017   PLT 222 09/10/2017    BMET    Component Value Date/Time   NA 134 (L) 09/10/2017 2331   K 3.6 09/10/2017 2331   CL 100 09/10/2017 2331   CO2 24 09/10/2017 2331   GLUCOSE 162 (H) 09/10/2017 2331   BUN 12 09/10/2017 2331   CREATININE 0.80 04/24/2021 1534   CALCIUM 8.9 09/10/2017 2331   GFRNONAA >60 09/10/2017 2331   GFRAA >60 09/10/2017 2331   Estimated Creatinine Clearance: 53.5 mL/min (by C-G formula based  on SCr of 0.8 mg/dL).  COAG No results found for: INR, PROTIME  Radiology CT Angio Neck W/Cm &/Or Wo/Cm  Result Date: 04/24/2021 CLINICAL DATA:  Asymptomatic carotid artery stenosis EXAM: CT ANGIOGRAPHY NECK TECHNIQUE: Multidetector CT imaging of the neck was performed using the standard protocol during bolus administration of intravenous contrast. Multiplanar CT image reconstructions and MIPs were obtained to evaluate the vascular anatomy. Carotid stenosis measurements (when applicable) are obtained utilizing NASCET criteria, using the distal internal carotid diameter as the denominator. RADIATION DOSE REDUCTION: This exam was performed according to the departmental  dose-optimization program which includes automated exposure control, adjustment of the mA and/or kV according to patient size and/or use of iterative reconstruction technique. CONTRAST:  23mL OMNIPAQUE IOHEXOL 350 MG/ML SOLN COMPARISON:  None. FINDINGS: Aortic arch: Mild calcified plaque the arch. Mixed plaque at the patent great vessel origins. No high-grade proximal subclavian stenosis. Right carotid system: Common carotid is patent. Internal and external carotids are patent. There is calcified plaque at the bifurcation and noncalcified plaque at the ICA origin. This results in approximately 55% stenosis. Left carotid system: Common, internal, and external carotid arteries are patent. Mixed plaque at the bifurcation. There is marked stenosis with near occlusion at the ECA origin. Mixed but primarily noncalcified plaque along the proximal ICA causing at least 70% stenosis. Streak artifact through this area limits accuracy of measurement. Vertebral arteries: Patent and codominant.  No significant stenosis. Skeleton: Cervical spine degenerative changes, greatest at C5-C6. Other neck: Subcentimeter thyroid nodules. No ultrasound is recommended by current guidelines. Upper chest: Emphysema. IMPRESSION: Plaque at the right ICA origin causes  approximately 50% stenosis. Plaque at the proximal left ICA causes at least 70% stenosis. Emphysema. Electronically Signed   By: Macy Mis M.D.   On: 04/24/2021 15:58   VAS US CAROTID  Result Date: 04/14/2021 Carotid Arterial Duplex Study Patient Name:  VALENA IVANOV  Date of Exam:   04/08/2021 Medical Rec #: [id number]         Accession #:    [id number] Date of Birth: 12-Aug-1950        Patient Gender: F Patient Age:   28 years Exam Location:  [facility] Procedure:      VAS US CAROTID Referring Phys: Leotis Pain --------------------------------------------------------------------------------  Indications:       Carotid artery disease. Comparison Study:  10/08/2020 Performing Technologist: Almira Coaster RVS  Examination Guidelines: A complete evaluation includes B-mode imaging, spectral Doppler, color Doppler, and power Doppler as needed of all accessible portions of each vessel. Bilateral testing is considered an integral part of a complete examination. Limited examinations for reoccurring indications may be performed as noted.  Right Carotid Findings: +----------+--------+--------+--------+------------------+--------+             PSV cm/s EDV cm/s Stenosis Plaque Description Comments  +----------+--------+--------+--------+------------------+--------+  CCA Prox   96       27                                             +----------+--------+--------+--------+------------------+--------+  CCA Mid    107      30                                             +----------+--------+--------+--------+------------------+--------+  CCA Distal 98       31                                             +----------+--------+--------+--------+------------------+--------+  ICA Prox   253      50                                             +----------+--------+--------+--------+------------------+--------+  ICA Mid    217      66                                              +----------+--------+--------+--------+------------------+--------+  ICA Distal 155      54                                             +----------+--------+--------+--------+------------------+--------+  ECA        198      31                                             +----------+--------+--------+--------+------------------+--------+ +----------+--------+-------+--------+-------------------+             PSV cm/s EDV cms Describe Arm Pressure (mmHG)  +----------+--------+-------+--------+-------------------+  Subclavian 194      0                                     +----------+--------+-------+--------+-------------------+ +---------+--------+--+--------+--+  Vertebral PSV cm/s 70 EDV cm/s 17  +---------+--------+--+--------+--+  Left Carotid Findings: +----------+--------+--------+--------+------------------+--------+             PSV cm/s EDV cm/s Stenosis Plaque Description Comments  +----------+--------+--------+--------+------------------+--------+  CCA Prox   79       17                                             +----------+--------+--------+--------+------------------+--------+  CCA Mid    70       18                                             +----------+--------+--------+--------+------------------+--------+  CCA Distal 52       19                                             +----------+--------+--------+--------+------------------+--------+  ICA Prox   334      108                                            +----------+--------+--------+--------+------------------+--------+  ICA Mid    263      75                                             +----------+--------+--------+--------+------------------+--------+  ICA Distal 113      32                                             +----------+--------+--------+--------+------------------+--------+  ECA        83       14                                             +----------+--------+--------+--------+------------------+--------+  +----------+--------+--------+--------+-------------------+             PSV cm/s EDV cm/s Describe Arm Pressure (mmHG)  +----------+--------+--------+--------+-------------------+  Subclavian 274                                             +----------+--------+--------+--------+-------------------+ +---------+--------+--+--------+--+  Vertebral PSV cm/s 69 EDV cm/s 22  +---------+--------+--+--------+--+   Summary: Right Carotid: Velocities in the right ICA are consistent with a 60-79%                stenosis. Left Carotid: Velocities and Ratios suggest a 80-99% stenosis on the Low End of               the Sclae in the Left ICA. Vertebrals:  Bilateral vertebral arteries demonstrate antegrade flow. Subclavians: Normal flow hemodynamics were seen in bilateral subclavian              arteries. *See table(s) above for measurements and observations.  Electronically signed by Leotis Pain MD on 04/14/2021 at 10:21:59 AM.    Final      Assessment/Plan Benign essential hypertension blood pressure control important in reducing the progression of atherosclerotic disease. On appropriate oral medications.     GERD (gastroesophageal reflux disease) Continue PPI as already ordered, this medication has been reviewed and there are no changes at this time. Avoidence of caffeine and alcohol Moderate elevation of the head of the bed   Hyperlipidemia, mixed lipid control important in reducing the progression of atherosclerotic disease. Continue statin therapy   Carotid stenosis She has undergone a CT angiogram of the neck which I have independently reviewed.  The official report is of an at least 50% right carotid stenosis and at least 70% left carotid stenosis, but this would be a significant underrepresentation of the degree of stenosis based on my review.  I think the right side is more in the 60 to 65% range and the left side is clearly greater than 80% and probably closer to 90%.  The lesion is also fairly high on  the left approaching C2 and may be difficult to access surgically. Given this finding, stroke risk reduction favors revascularization.  Given the high lesion and the difficult anatomy, carotid artery stenting is likely a preferable option.  Her arch seems reasonably favorable.  She has some tortuosity but not dense calcification I think we can manage this.  Risks and benefits of carotid stenting were discussed with the patient and she is agreeable to proceed with left carotid stent placement.  We will prescribe Plavix and this can be started tomorrow.    Leotis Pain, MD  04/29/2021 3:25 PM    This note was created with Dragon medical transcription system.  Any errors from dictation are purely unintentional

## 2021-05-06 ENCOUNTER — Telehealth (INDEPENDENT_AMBULATORY_CARE_PROVIDER_SITE_OTHER): Payer: Self-pay

## 2021-05-06 NOTE — Telephone Encounter (Signed)
Patient called in and has been scheduled with Dr. Lucky Cowboy for a left carotid stent placement on 05/12/21 with a 8:15 am arrival time to the [facility]. Covid testing on 05/09/21 between 8-12 pm at the [facility]. Pre-procedure instructions were discussed and will be mailed. ?

## 2021-05-09 ENCOUNTER — Other Ambulatory Visit: Payer: Self-pay

## 2021-05-09 ENCOUNTER — Other Ambulatory Visit
Admission: RE | Admit: 2021-05-09 | Discharge: 2021-05-09 | Disposition: A | Discharge status: Home / Self Care | Payer: Medicare HMO | Source: Ambulatory Visit | Attending: Vascular Surgery | Admitting: Vascular Surgery

## 2021-05-09 DIAGNOSIS — Z01812 Encounter for preprocedural laboratory examination: Secondary | ICD-10-CM | POA: Insufficient documentation

## 2021-05-09 DIAGNOSIS — Z20822 Contact with and (suspected) exposure to covid-19: Secondary | ICD-10-CM

## 2021-05-09 LAB — SARS CORONAVIRUS 2 (TAT 6-24 HRS): SARS Coronavirus 2: NEGATIVE

## 2021-05-12 ENCOUNTER — Other Ambulatory Visit: Payer: Self-pay

## 2021-05-12 ENCOUNTER — Encounter
Admission: RE | Disposition: A | Discharge status: Home / Self Care | Payer: Self-pay | Source: Ambulatory Visit | Attending: Vascular Surgery

## 2021-05-12 ENCOUNTER — Other Ambulatory Visit (INDEPENDENT_AMBULATORY_CARE_PROVIDER_SITE_OTHER): Payer: Self-pay | Admitting: Nurse Practitioner

## 2021-05-12 ENCOUNTER — Encounter: Payer: Self-pay | Admitting: Vascular Surgery

## 2021-05-12 ENCOUNTER — Inpatient Hospital Stay
Admission: RE | Admit: 2021-05-12 | Discharge: 2021-05-15 | DRG: 036 | Disposition: A | Discharge status: Home / Self Care | Payer: Medicare HMO | Attending: Vascular Surgery | Admitting: Vascular Surgery

## 2021-05-12 DIAGNOSIS — Z7902 Long term (current) use of antithrombotics/antiplatelets: Secondary | ICD-10-CM

## 2021-05-12 DIAGNOSIS — Z90710 Acquired absence of both cervix and uterus: Secondary | ICD-10-CM

## 2021-05-12 DIAGNOSIS — Z803 Family history of malignant neoplasm of breast: Secondary | ICD-10-CM | POA: Diagnosis not present

## 2021-05-12 DIAGNOSIS — K219 Gastro-esophageal reflux disease without esophagitis: Secondary | ICD-10-CM | POA: Diagnosis present

## 2021-05-12 DIAGNOSIS — I6522 Occlusion and stenosis of left carotid artery: Secondary | ICD-10-CM | POA: Diagnosis present

## 2021-05-12 DIAGNOSIS — Z006 Encounter for examination for normal comparison and control in clinical research program: Secondary | ICD-10-CM

## 2021-05-12 DIAGNOSIS — I6523 Occlusion and stenosis of bilateral carotid arteries: Principal | ICD-10-CM | POA: Diagnosis present

## 2021-05-12 DIAGNOSIS — I10 Essential (primary) hypertension: Secondary | ICD-10-CM | POA: Diagnosis present

## 2021-05-12 DIAGNOSIS — I959 Hypotension, unspecified: Secondary | ICD-10-CM | POA: Diagnosis not present

## 2021-05-12 DIAGNOSIS — I773 Arterial fibromuscular dysplasia: Secondary | ICD-10-CM

## 2021-05-12 DIAGNOSIS — Z821 Family history of blindness and visual loss: Secondary | ICD-10-CM

## 2021-05-12 DIAGNOSIS — Z8249 Family history of ischemic heart disease and other diseases of the circulatory system: Secondary | ICD-10-CM | POA: Diagnosis not present

## 2021-05-12 DIAGNOSIS — R112 Nausea with vomiting, unspecified: Secondary | ICD-10-CM | POA: Diagnosis not present

## 2021-05-12 DIAGNOSIS — E782 Mixed hyperlipidemia: Secondary | ICD-10-CM | POA: Diagnosis present

## 2021-05-12 DIAGNOSIS — Z818 Family history of other mental and behavioral disorders: Secondary | ICD-10-CM

## 2021-05-12 DIAGNOSIS — Z20822 Contact with and (suspected) exposure to covid-19: Secondary | ICD-10-CM | POA: Diagnosis present

## 2021-05-12 DIAGNOSIS — Z79899 Other long term (current) drug therapy: Secondary | ICD-10-CM

## 2021-05-12 DIAGNOSIS — Z825 Family history of asthma and other chronic lower respiratory diseases: Secondary | ICD-10-CM | POA: Diagnosis not present

## 2021-05-12 DIAGNOSIS — F419 Anxiety disorder, unspecified: Secondary | ICD-10-CM | POA: Diagnosis present

## 2021-05-12 DIAGNOSIS — F17210 Nicotine dependence, cigarettes, uncomplicated: Secondary | ICD-10-CM | POA: Diagnosis present

## 2021-05-12 HISTORY — PX: CAROTID PTA/STENT INTERVENTION: CATH118231

## 2021-05-12 LAB — CREATININE, SERUM
Creatinine, Ser: 0.77 mg/dL (ref 0.44–1.00)
GFR, Estimated: >60 mL/min (ref >60)

## 2021-05-12 LAB — MRSA NEXT GEN BY PCR, NASAL: MRSA by PCR Next Gen: NOT DETECTED

## 2021-05-12 LAB — POCT ACTIVATED CLOTTING TIME: Activated Clotting Time: 359 seconds

## 2021-05-12 LAB — GLUCOSE, CAPILLARY: Glucose-Capillary: 138 mg/dL — ABNORMAL HIGH (ref 70–99)

## 2021-05-12 LAB — BUN: BUN: 14 mg/dL (ref 8–23)

## 2021-05-12 SURGERY — CAROTID PTA/STENT INTERVENTION
Anesthesia: Moderate Sedation | Laterality: Left

## 2021-05-12 MED ORDER — ATROPINE SULFATE 1 MG/10ML IJ SOSY
PREFILLED_SYRINGE | INTRAMUSCULAR | Status: AC
  Filled 2021-05-12: qty 10

## 2021-05-12 MED ORDER — FENTANYL CITRATE (PF) 100 MCG/2ML IJ SOLN
INTRAMUSCULAR | Status: DC | PRN
  Administered 2021-05-12: 50 ug via INTRAVENOUS

## 2021-05-12 MED ORDER — ADULT MULTIVITAMIN W/MINERALS CH
1.0000 | ORAL_TABLET | Freq: Every day | ORAL | Status: DC
  Administered 2021-05-12 – 2021-05-15 (×4): 1 via ORAL
  Filled 2021-05-12 (×4): qty 1

## 2021-05-12 MED ORDER — PROMETHAZINE HCL 25 MG/ML IJ SOLN
12.5000 mg | Freq: Four times a day (QID) | INTRAMUSCULAR | Status: DC | PRN
Start: 2021-05-12 — End: 2021-05-15
  Administered 2021-05-12: 12.5 mg via INTRAVENOUS
  Filled 2021-05-12 (×2): qty 0.5

## 2021-05-12 MED ORDER — MIDAZOLAM HCL 2 MG/2ML IJ SOLN
INTRAMUSCULAR | Status: DC | PRN
  Administered 2021-05-12: 2 mg via INTRAVENOUS

## 2021-05-12 MED ORDER — SODIUM CHLORIDE 0.9 % IV SOLN
500.0000 mL | Freq: Once | INTRAVENOUS | Status: AC | PRN
  Administered 2021-05-13: 500 mL via INTRAVENOUS

## 2021-05-12 MED ORDER — MAGNESIUM SULFATE 2 GM/50ML IV SOLN
2.0000 g | Freq: Every day | INTRAVENOUS | Status: DC | PRN
  Filled 2021-05-12: qty 50

## 2021-05-12 MED ORDER — DOPAMINE-DEXTROSE 3.2-5 MG/ML-% IV SOLN
0.0000 ug/kg/min | INTRAVENOUS | Status: DC
  Administered 2021-05-13: 10 ug/kg/min via INTRAVENOUS
  Filled 2021-05-12 (×2): qty 250

## 2021-05-12 MED ORDER — CEFAZOLIN SODIUM-DEXTROSE 2-4 GM/100ML-% IV SOLN
INTRAVENOUS | Status: AC
  Administered 2021-05-12: 2 g via INTRAVENOUS
  Filled 2021-05-12: qty 100

## 2021-05-12 MED ORDER — FENTANYL CITRATE PF 50 MCG/ML IJ SOSY
PREFILLED_SYRINGE | INTRAMUSCULAR | Status: AC
  Filled 2021-05-12: qty 2

## 2021-05-12 MED ORDER — MIDAZOLAM HCL 2 MG/ML PO SYRP: 8.0000 mg | ORAL_SOLUTION | Freq: Once | ORAL | Status: DC | PRN

## 2021-05-12 MED ORDER — ACETAMINOPHEN 325 MG PO TABS
325.0000 mg | ORAL_TABLET | ORAL | Status: DC | PRN
  Administered 2021-05-15: 650 mg via ORAL
  Filled 2021-05-12: qty 2

## 2021-05-12 MED ORDER — IODIXANOL 320 MG/ML IV SOLN
INTRAVENOUS | Status: DC | PRN
  Administered 2021-05-12: 70 mL

## 2021-05-12 MED ORDER — HEPARIN SODIUM (PORCINE) 1000 UNIT/ML IJ SOLN
INTRAMUSCULAR | Status: DC | PRN
  Administered 2021-05-12: 7000 [IU] via INTRAVENOUS

## 2021-05-12 MED ORDER — ATROPINE SULFATE 1 MG/10ML IJ SOSY
PREFILLED_SYRINGE | INTRAMUSCULAR | Status: DC | PRN
  Administered 2021-05-12: 1 mg via INTRAVENOUS

## 2021-05-12 MED ORDER — BISOPROLOL-HYDROCHLOROTHIAZIDE 5-6.25 MG PO TABS
1.0000 | ORAL_TABLET | Freq: Every day | ORAL | Status: DC
  Filled 2021-05-12: qty 1

## 2021-05-12 MED ORDER — MIDAZOLAM HCL 2 MG/2ML IJ SOLN
INTRAMUSCULAR | Status: AC
  Filled 2021-05-12: qty 4

## 2021-05-12 MED ORDER — DOPAMINE-DEXTROSE 3.2-5 MG/ML-% IV SOLN
INTRAVENOUS | Status: AC
  Administered 2021-05-12: 10 ug/kg/min via INTRAVENOUS
  Filled 2021-05-12: qty 250

## 2021-05-12 MED ORDER — SODIUM CHLORIDE 0.9 % IV BOLUS
INTRAVENOUS | Status: DC | PRN
Start: 2021-05-12 — End: 2021-05-12
  Administered 2021-05-12: 500 mL via INTRAVENOUS

## 2021-05-12 MED ORDER — HYDROCHLOROTHIAZIDE 25 MG PO TABS
25.0000 mg | ORAL_TABLET | Freq: Every day | ORAL | Status: DC
  Filled 2021-05-12: qty 1

## 2021-05-12 MED ORDER — CLOPIDOGREL BISULFATE 75 MG PO TABS
75.0000 mg | ORAL_TABLET | Freq: Every day | ORAL | Status: DC
  Administered 2021-05-12 – 2021-05-15 (×4): 75 mg via ORAL
  Filled 2021-05-12 (×4): qty 1

## 2021-05-12 MED ORDER — CEFAZOLIN SODIUM-DEXTROSE 2-4 GM/100ML-% IV SOLN
2.0000 g | Freq: Three times a day (TID) | INTRAVENOUS | Status: AC
  Administered 2021-05-12 – 2021-05-13 (×2): 2 g via INTRAVENOUS
  Filled 2021-05-12 (×3): qty 100

## 2021-05-12 MED ORDER — HEPARIN SODIUM (PORCINE) 1000 UNIT/ML IJ SOLN
INTRAMUSCULAR | Status: AC
  Filled 2021-05-12: qty 10

## 2021-05-12 MED ORDER — HYDROMORPHONE HCL 1 MG/ML IJ SOLN: 1.0000 mg | Freq: Once | INTRAMUSCULAR | Status: DC | PRN

## 2021-05-12 MED ORDER — PHENYLEPHRINE 40 MCG/ML (10ML) SYRINGE FOR IV PUSH (FOR BLOOD PRESSURE SUPPORT)
PREFILLED_SYRINGE | INTRAVENOUS | Status: DC | PRN
  Administered 2021-05-12: 2 ug via INTRAVENOUS

## 2021-05-12 MED ORDER — MORPHINE SULFATE (PF) 4 MG/ML IV SOLN: 2.0000 mg | INTRAVENOUS | Status: DC | PRN

## 2021-05-12 MED ORDER — FAMOTIDINE 20 MG PO TABS: 40.0000 mg | ORAL_TABLET | Freq: Once | ORAL | Status: DC | PRN

## 2021-05-12 MED ORDER — ONDANSETRON HCL 4 MG/2ML IJ SOLN
4.0000 mg | Freq: Four times a day (QID) | INTRAMUSCULAR | Status: DC | PRN
Start: 2021-05-12 — End: 2021-05-12

## 2021-05-12 MED ORDER — METOPROLOL TARTRATE 5 MG/5ML IV SOLN: 2.0000 mg | INTRAVENOUS | Status: DC | PRN

## 2021-05-12 MED ORDER — LABETALOL HCL 5 MG/ML IV SOLN: 10.0000 mg | INTRAVENOUS | Status: DC | PRN

## 2021-05-12 MED ORDER — POTASSIUM CHLORIDE CRYS ER 20 MEQ PO TBCR: 20.0000 meq | EXTENDED_RELEASE_TABLET | Freq: Every day | ORAL | Status: DC | PRN

## 2021-05-12 MED ORDER — ACETAMINOPHEN 325 MG RE SUPP
325.0000 mg | RECTAL | Status: DC | PRN
  Filled 2021-05-12: qty 2

## 2021-05-12 MED ORDER — OXYCODONE-ACETAMINOPHEN 5-325 MG PO TABS: 1.0000 | ORAL_TABLET | ORAL | Status: DC | PRN

## 2021-05-12 MED ORDER — SODIUM CHLORIDE 0.9 % IV SOLN: INTRAVENOUS | Status: DC

## 2021-05-12 MED ORDER — ALUM & MAG HYDROXIDE-SIMETH 200-200-20 MG/5ML PO SUSP: 15.0000 mL | ORAL | Status: DC | PRN

## 2021-05-12 MED ORDER — FAMOTIDINE IN NACL 20-0.9 MG/50ML-% IV SOLN
20.0000 mg | Freq: Two times a day (BID) | INTRAVENOUS | Status: DC
  Administered 2021-05-12 – 2021-05-13 (×4): 20 mg via INTRAVENOUS
  Filled 2021-05-12 (×4): qty 50

## 2021-05-12 MED ORDER — ASPIRIN EC 81 MG PO TBEC: 81.0000 mg | DELAYED_RELEASE_TABLET | Freq: Every day | ORAL | Status: DC

## 2021-05-12 MED ORDER — ASPIRIN EC 81 MG PO TBEC
81.0000 mg | DELAYED_RELEASE_TABLET | Freq: Every day | ORAL | Status: DC
  Administered 2021-05-13 – 2021-05-15 (×3): 81 mg via ORAL
  Filled 2021-05-12 (×3): qty 1

## 2021-05-12 MED ORDER — VITAMIN D 25 MCG (1000 UNIT) PO TABS
5000.0000 [IU] | ORAL_TABLET | Freq: Every morning | ORAL | Status: DC
  Administered 2021-05-12 – 2021-05-15 (×4): 5000 [IU] via ORAL
  Filled 2021-05-12 (×4): qty 5

## 2021-05-12 MED ORDER — PANTOPRAZOLE SODIUM 40 MG PO TBEC
40.0000 mg | DELAYED_RELEASE_TABLET | Freq: Every day | ORAL | Status: DC
  Administered 2021-05-12 – 2021-05-15 (×4): 40 mg via ORAL
  Filled 2021-05-12 (×4): qty 1

## 2021-05-12 MED ORDER — ONDANSETRON HCL 4 MG/2ML IJ SOLN
4.0000 mg | Freq: Four times a day (QID) | INTRAMUSCULAR | Status: DC | PRN
  Administered 2021-05-12 – 2021-05-13 (×3): 4 mg via INTRAVENOUS
  Filled 2021-05-12 (×5): qty 2

## 2021-05-12 MED ORDER — PHENOL 1.4 % MT LIQD
1.0000 | OROMUCOSAL | Status: DC | PRN
  Filled 2021-05-12: qty 177

## 2021-05-12 MED ORDER — HYDROCHLOROTHIAZIDE 25 MG PO TABS: 25.0000 mg | ORAL_TABLET | Freq: Every day | ORAL | Status: DC

## 2021-05-12 MED ORDER — METHYLPREDNISOLONE SODIUM SUCC 125 MG IJ SOLR: 125.0000 mg | Freq: Once | INTRAMUSCULAR | Status: DC | PRN

## 2021-05-12 MED ORDER — CEFAZOLIN SODIUM-DEXTROSE 2-4 GM/100ML-% IV SOLN: 2.0000 g | Freq: Once | INTRAVENOUS | Status: AC

## 2021-05-12 MED ORDER — ONDANSETRON HCL 4 MG/2ML IJ SOLN
4.0000 mg | INTRAMUSCULAR | Status: AC
  Administered 2021-05-12: 4 mg via INTRAVENOUS

## 2021-05-12 MED ORDER — VENLAFAXINE HCL ER 75 MG PO CP24
150.0000 mg | ORAL_CAPSULE | Freq: Every day | ORAL | Status: DC
  Administered 2021-05-12 – 2021-05-15 (×4): 150 mg via ORAL
  Filled 2021-05-12 (×4): qty 2

## 2021-05-12 MED ORDER — ROSUVASTATIN CALCIUM 10 MG PO TABS
10.0000 mg | ORAL_TABLET | Freq: Every day | ORAL | Status: DC
  Administered 2021-05-13 – 2021-05-15 (×3): 10 mg via ORAL
  Filled 2021-05-12 (×4): qty 1

## 2021-05-12 MED ORDER — ONDANSETRON HCL 4 MG/2ML IJ SOLN
INTRAMUSCULAR | Status: AC
  Administered 2021-05-12: 4 mg via INTRAVENOUS
  Filled 2021-05-12: qty 2

## 2021-05-12 MED ORDER — HYDRALAZINE HCL 20 MG/ML IJ SOLN: 5.0000 mg | INTRAMUSCULAR | Status: DC | PRN

## 2021-05-12 MED ORDER — GUAIFENESIN-DM 100-10 MG/5ML PO SYRP
15.0000 mL | ORAL_SOLUTION | ORAL | Status: DC | PRN
  Filled 2021-05-12: qty 15

## 2021-05-12 MED ORDER — POTASSIUM CHLORIDE CRYS ER 20 MEQ PO TBCR
10.0000 meq | EXTENDED_RELEASE_TABLET | Freq: Every day | ORAL | Status: DC
  Administered 2021-05-12 – 2021-05-15 (×4): 10 meq via ORAL
  Filled 2021-05-12 (×4): qty 1

## 2021-05-12 MED ORDER — DIPHENHYDRAMINE HCL 50 MG/ML IJ SOLN: 50.0000 mg | Freq: Once | INTRAMUSCULAR | Status: DC | PRN

## 2021-05-12 SURGICAL SUPPLY — 19 items
BALLN VTRAC 4.5X20X135 (BALLOONS) ×3
BALLOON VTRAC 4.5X20X135 (BALLOONS) IMPLANT
CATH ANGIO 5F PIGTAIL 100CM (CATHETERS) ×2 IMPLANT
CATH BEACON 5 .035 100 H1 TIP (CATHETERS) ×2 IMPLANT
COVER PROBE U/S 5X48 (MISCELLANEOUS) ×2 IMPLANT
DEVICE EMBOSHIELD NAV6 4.0-7.0 (FILTER) ×2 IMPLANT
DEVICE SAFEGUARD 24CM (GAUZE/BANDAGES/DRESSINGS) ×2 IMPLANT
DEVICE STARCLOSE SE CLOSURE (Vascular Products) ×2 IMPLANT
DEVICE TORQUE .025-.038 (MISCELLANEOUS) ×2 IMPLANT
GLIDEWIRE ANGLED SS 035X260CM (WIRE) ×2 IMPLANT
GUIDEWIRE VASC STIFF .038X260 (WIRE) ×2 IMPLANT
KIT CAROTID MANIFOLD (MISCELLANEOUS) ×2 IMPLANT
KIT ENCORE 26 ADVANTAGE (KITS) ×2 IMPLANT
PACK ANGIOGRAPHY (CUSTOM PROCEDURE TRAY) ×3 IMPLANT
SHEATH BRITE TIP 5FRX11 (SHEATH) ×2 IMPLANT
SHEATH SHUTTLE 6FRX80 (SHEATH) ×2 IMPLANT
STENT XACT CAR 9-7X40X136 (Permanent Stent) ×2 IMPLANT
SYR MEDRAD MARK 7 150ML (SYRINGE) ×2 IMPLANT
WIRE GUIDERIGHT .035X150 (WIRE) ×2 IMPLANT

## 2021-05-12 NOTE — Interval H&P Note (Signed)
History and Physical Interval Note: ? ?05/12/2021 ?7:54 AM ? ?Kaitlin Miller  has presented today for surgery, with the diagnosis of L Carotid Stent    [place]   Carotid artery stenosis.  The various methods of treatment have been discussed with the patient and family. After consideration of risks, benefits and other options for treatment, the patient has consented to  Procedure(s): ?CAROTID PTA/STENT INTERVENTION (Left) as a surgical intervention.  The patient's history has been reviewed, patient examined, no change in status, stable for surgery.  I have reviewed the patient's chart and labs.  Questions were answered to the patient's satisfaction.   ? ? ?Leotis Pain ? ? ?

## 2021-05-12 NOTE — Op Note (Signed)
? ? ?OPERATIVE NOTE ?DATE: 05/12/2021 ? ?PROCEDURE: ? Ultrasound guidance for vascular access right femoral artery ? Placement of a 9 mm proximal, 7 mm distal, 4 cm long Exact stent with the use of the NAV-6 embolic protection device in the left carotid artery ? ?PRE-OPERATIVE DIAGNOSIS: 1.  High-grade left carotid artery stenosis and moderate right carotid artery stenosis. ? ? 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 ?SURGEON: Leotis Pain, MD ? ?ASSISTANT(S): None ? ?ANESTHESIA: local/MCS ? ?ESTIMATED BLOOD LOSS: 15 cc ? ?CONTRAST: 70 cc ? ?FLUORO TIME: 6 minutes ? ?MODERATE CONSCIOUS SEDATION TIME:  Approximately 40 minutes using 2 mg of Versed and 50 mcg of Fentanyl ? ?FINDING(S): ?1.   80 to 85% proximal left internal carotid artery stenosis.  Marked changes of fibromuscular dysplasia with mild to moderate stenosis in the distal internal carotid beyond the atherosclerotic lesion ? ?SPECIMEN(S):   none ? ?INDICATIONS:   ?Patient is a 71 y.o. female who presents with left carotid artery stenosis.  The patient has a high lesion that would be difficult to access surgically and carotid artery stenting was felt to be preferred to endarterectomy for that reason.  Risks and benefits were discussed and informed consent was obtained.  ? ?DESCRIPTION: ?After obtaining full informed written consent, the patient was brought back to the vascular suite and placed supine upon the table.  The patient received IV antibiotics prior to induction. Moderate conscious sedation was administered during a face to face encounter with the patient throughout the procedure with my supervision of the RN administering medicines and monitoring the patients vital signs and mental status throughout from the start of the procedure until the patient was taken to the recovery room.  After obtaining adequate anesthesia, the patient was  prepped and draped in the standard fashion.   The right femoral artery was visualized with ultrasound and found to be widely patent. It was then accessed under direct ultrasound guidance without difficulty with a Seldinger needle. A permanent image was recorded. A J-wire was placed and we then placed a 6 French sheath. The patient was then heparinized and a total of 7000 units of intravenous heparin were given and an ACT was checked to confirm successful anticoagulation. A pigtail catheter was then placed into the ascending aorta. This showed relatively normal origins of the great vessels with a near bovine configuration. I then selectively cannulated the left common carotid artery without difficulty with a headhunter catheter and advanced into the mid left common carotid artery.  Cervical and cerebral carotid angiography was then performed. There were no obvious intracranial filling defects with somewhat sluggish flow particularly in the anterior cerebral circulation. The carotid bifurcation demonstrated 80 to 85% proximal left internal carotid artery stenosis.  Marked changes of fibromuscular dysplasia with mild to moderate stenosis in the distal internal carotid beyond the atherosclerotic lesion.  I then exchanged for the Amplatz Super Stiff wire. Over the Amplatz Super Stiff wire, a 6 [place] shuttle sheath was placed into the mid common carotid artery. I then used the NAV-6  Embolic protection device and crossed the lesion and parked this in the distal internal carotid artery at the base of the skull.  I then selected a 9 mm proximal, 7 mm distal, 4 cm long exact stent. This was deployed across the lesion encompassing it in its entirety. A 4.5 mm diameter by 2 cm length balloon was used to post  dilate the stent. Only about a 15-20% residual stenosis was present after angioplasty.  Due to the diffuse fibromuscular dysplasia in the distal internal carotid artery and not having clear high-grade stenosis in these  areas, no further intervention was performed on the fibromuscular dysplasia distal to the stent.  Completion angiogram showed normal intracranial filling without new defects. At this point I elected to terminate the procedure. The sheath was removed and StarClose closure device was deployed in the right femoral artery with excellent hemostatic result. The patient was taken to the recovery room in stable condition having tolerated the procedure well. ? ?COMPLICATIONS: none ? ?CONDITION: stable ? ?Leotis Pain ?05/12/2021 ?10:11 AM ? ? ?This note was created with Dragon Medical transcription system. Any errors in dictation are purely unintentional.  ?

## 2021-05-12 NOTE — Progress Notes (Signed)
Dr. Lucky Cowboy at bedside seeing pt. Made aware of BP in 70's.Orders received. Pt. C/o nausea. Pt. Placed in trendelenberg. Dopamine gtt. Started at 10 mcg/kg/min. Now.  ?

## 2021-05-12 NOTE — Progress Notes (Signed)
Pt has being remarkable since she came back to room after her carotid  procedure.  VSS, A/ox4, , pupils equal and reactive to light,  MAE equally strong and purposefully. Pt tolerated Po well till the evening around 1830 and she vomited, Zofran given as ordered. Pt vomited again 1910 550 mils of emesis and MD was communicated with and pending responses and hand off to night shift RN Caryl Pina RN  ?

## 2021-05-12 NOTE — Plan of Care (Signed)
Care plan reviewed with pt and spouse ? ?

## 2021-05-12 NOTE — Progress Notes (Signed)
Dr. Lucky Cowboy came back by to see pt. Additional order for 2nd Zofran 4 mg IV given now secondary to pt. Continued c/o nausea. VSS. No neuro deficits at present. Stable for tx to ICU.  ?

## 2021-05-13 DIAGNOSIS — Z95820 Peripheral vascular angioplasty status with implants and grafts: Secondary | ICD-10-CM

## 2021-05-13 DIAGNOSIS — Z48812 Encounter for surgical aftercare following surgery on the circulatory system: Secondary | ICD-10-CM

## 2021-05-13 LAB — CBC
HCT: 34.1 % — ABNORMAL LOW (ref 36.0–46.0)
Hemoglobin: 11.5 g/dL — ABNORMAL LOW (ref 12.0–15.0)
MCH: 29.6 pg (ref 26.0–34.0)
MCHC: 33.7 g/dL (ref 30.0–36.0)
MCV: 87.9 fL (ref 80.0–100.0)
Platelets: 233 10*3/uL (ref 150–400)
RBC: 3.88 MIL/uL (ref 3.87–5.11)
RDW: 14.2 % (ref 11.5–15.5)
WBC: 14.3 10*3/uL — ABNORMAL HIGH (ref 4.0–10.5)
nRBC: 0 % (ref 0.0–0.2)

## 2021-05-13 LAB — BASIC METABOLIC PANEL
Anion gap: 8 (ref 5–15)
Anion gap: 3 — ABNORMAL LOW (ref 5–15)
BUN: 15 mg/dL (ref 8–23)
BUN: 11 mg/dL (ref 8–23)
CO2: 26 mmol/L (ref 22–32)
CO2: 24 mmol/L (ref 22–32)
Calcium: 8.4 mg/dL — ABNORMAL LOW (ref 8.9–10.3)
Calcium: 7.7 mg/dL — ABNORMAL LOW (ref 8.9–10.3)
Chloride: 105 mmol/L (ref 98–111)
Chloride: 107 mmol/L (ref 98–111)
Creatinine, Ser: 1.01 mg/dL — ABNORMAL HIGH (ref 0.44–1.00)
Creatinine, Ser: 0.88 mg/dL (ref 0.44–1.00)
GFR, Estimated: 59 mL/min — ABNORMAL LOW (ref >60)
GFR, Estimated: >60 mL/min (ref >60)
Glucose, Bld: 110 mg/dL — ABNORMAL HIGH (ref 70–99)
Glucose, Bld: 113 mg/dL — ABNORMAL HIGH (ref 70–99)
Potassium: 2.7 mmol/L — CL (ref 3.5–5.1)
Potassium: 3.3 mmol/L — ABNORMAL LOW (ref 3.5–5.1)
Sodium: 139 mmol/L (ref 135–145)
Sodium: 134 mmol/L — ABNORMAL LOW (ref 135–145)

## 2021-05-13 MED ORDER — SODIUM CHLORIDE 0.9 % IV SOLN: Freq: Once | INTRAVENOUS | Status: AC

## 2021-05-13 MED ORDER — CALCIUM GLUCONATE-NACL 2-0.675 GM/100ML-% IV SOLN
2.0000 g | Freq: Once | INTRAVENOUS | Status: AC
Start: 2021-05-13 — End: 2021-05-13
  Administered 2021-05-13: 2000 mg via INTRAVENOUS
  Filled 2021-05-13: qty 100

## 2021-05-13 MED ORDER — POTASSIUM CHLORIDE CRYS ER 20 MEQ PO TBCR
40.0000 meq | EXTENDED_RELEASE_TABLET | Freq: Once | ORAL | Status: AC
  Administered 2021-05-13: 40 meq via ORAL
  Filled 2021-05-13: qty 2

## 2021-05-13 MED ORDER — POTASSIUM CHLORIDE 10 MEQ/100ML IV SOLN
10.0000 meq | INTRAVENOUS | Status: AC
  Administered 2021-05-13 (×6): 10 meq via INTRAVENOUS
  Filled 2021-05-13 (×6): qty 100

## 2021-05-13 NOTE — Progress Notes (Signed)
?  Transition of Care (TOC) Screening Note ? ? ?Patient Details  ?Name: Kaitlin Miller ?Date of Birth: 10-24-1950 ? ? ?Transition of Care (TOC) CM/SW Contact:    ?Shelbie Hutching, RN ?Phone Number: ?05/13/2021, 5:09 PM ? ? ? ?Transition of Care Department [facility]) has reviewed patient and no TOC needs have been identified at this time. We will continue to monitor patient advancement through interdisciplinary progression rounds. If new patient transition needs arise, please place a TOC consult. ?  ?

## 2021-05-13 NOTE — Plan of Care (Signed)
Pt is a/ox4, denies any pain all day, pt continued on dopamine gtt, attempted several to wean her off but unsuccessful, Pt received 587ms of NS bolus x2 for low BP, Bp will impove after bolus and drop back down again. Pt stated feeling much better after bath and brushing her teeth, no nausea of vomiting since 9 am today and diet was advance to heart healthy diet and pt is tolerating it well. [facility] with any problem. Right groin PAD removed per protocol, site is at zero with small old brushing. ? ? ?

## 2021-05-13 NOTE — Progress Notes (Signed)
PHARMACY CONSULT NOTE - FOLLOW UP ? ?Pharmacy Consult for Electrolyte Monitoring and Replacement  ? ?Recent Labs: ?Potassium (mmol/L)  ?Date Value  ?05/13/2021 3.3 (L)  ? ?Calcium (mg/dL)  ?Date Value  ?05/13/2021 7.7 (L)  ? ?Albumin (g/dL)  ?Date Value  ?09/10/2017 3.8  ? ?Sodium (mmol/L)  ?Date Value  ?05/13/2021 134 (L)  ? ? ? ?Assessment: ?Pharmacy has been consulted to monitor and replace electrolytes in 70yo patient POD1 s/p carotid stent placement. ? ?IVF: NS'@100ml'$ /h ? ?Goal of Therapy:  ?Electrolytes WNL  ? ?Plan:  ?--KCl PO 51mq x 1(Patient also have 144m ordered daily) ?--Calcium gluconate 2g IV x 1 ?--Will recheck electrolytes with AM labs. ? ?WaPearla DubonnetPharmD ?Clinical Pharmacist ?05/13/2021 6:24 PM ? ?

## 2021-05-13 NOTE — Progress Notes (Signed)
[facility] ? ?Daily Progress Note ? ? ?Subjective  -  ? ?N/V overnight ?Still on Dopa/Neo.  BP up though this am ?Some pain but not severe ? ? ?Objective ?Vitals:  ? 05/13/21 0400 05/13/21 0432 05/13/21 0500 05/13/21 0600  ?BP: (!) 141/75 (!) 126/50 (!) 119/43 (!) 121/44  ?Pulse: 85 62 (!) 57 (!) 56  ?Resp: '20 18 16 15  '$ ?Temp:      ?TempSrc:      ?SpO2: 94% 96% 97% 95%  ?Weight:      ?Height:      ? ? ?Intake/Output Summary (Last 24 hours) at 05/13/2021 0832 ?Last data filed at 05/13/2021 0534 ?Gross per 24 hour  ?Intake 4181.14 ml  ?Output 200 ml  ?Net 3981.14 ml  ? ? ?PULM  CTAB ?CV  RRR ?VASC  Access site C/D/I, no obvious focal deficits ? ?Laboratory ?CBC ?   ?Component Value Date/Time  ? WBC 14.3 (H) 05/13/2021 0436  ? HGB 11.5 (L) 05/13/2021 0436  ? HCT 34.1 (L) 05/13/2021 0436  ? PLT 233 05/13/2021 0436  ? ? ?BMET ?   ?Component Value Date/Time  ? NA 139 05/13/2021 0436  ? K 2.7 (LL) 05/13/2021 0436  ? CL 105 05/13/2021 0436  ? CO2 26 05/13/2021 0436  ? GLUCOSE 110 (H) 05/13/2021 0436  ? BUN 15 05/13/2021 0436  ? CREATININE 1.01 (H) 05/13/2021 0436  ? CALCIUM 8.4 (L) 05/13/2021 0436  ? GFRNONAA 59 (L) 05/13/2021 0436  ? GFRAA >60 09/10/2017 2331  ? ? ?Assessment/Planning: ?POD #1 s/p carotid stent placement ? ?Still on Dopa/Neo ?Having a lot of N/V ?Unlikely to go home today and can't go to the floor until off Dopa/Neo ? ?Leotis Pain ? ?05/13/2021, 8:32 AM ? ? ? ?  ?

## 2021-05-13 NOTE — Progress Notes (Signed)
PHARMACY CONSULT NOTE - FOLLOW UP ? ?Pharmacy Consult for Electrolyte Monitoring and Replacement  ? ?Recent Labs: ?Potassium (mmol/L)  ?Date Value  ?05/13/2021 2.7 (LL)  ? ?Calcium (mg/dL)  ?Date Value  ?05/13/2021 8.4 (L)  ? ?Albumin (g/dL)  ?Date Value  ?09/10/2017 3.8  ? ?Sodium (mmol/L)  ?Date Value  ?05/13/2021 139  ? ? ? ?Assessment: ?3/14:  K @ 0436 = 2.7 ? ?Goal of Therapy:  ?Electrolytes WNL  ? ?Plan:  ?Will order KCl 10 mEq IV X 6 to start on 3/14 @ ~ 0600. ?Will recheck electrolytes on 3/14 @ 1400.  ? ?Trestin Vences D ,PharmD ?Clinical Pharmacist ?05/13/2021 5:48 AM ? ?

## 2021-05-14 DIAGNOSIS — I9581 Postprocedural hypotension: Secondary | ICD-10-CM

## 2021-05-14 LAB — BASIC METABOLIC PANEL
Anion gap: 6 (ref 5–15)
BUN: 11 mg/dL (ref 8–23)
CO2: 26 mmol/L (ref 22–32)
Calcium: 8.4 mg/dL — ABNORMAL LOW (ref 8.9–10.3)
Chloride: 107 mmol/L (ref 98–111)
Creatinine, Ser: 0.87 mg/dL (ref 0.44–1.00)
GFR, Estimated: >60 mL/min (ref >60)
Glucose, Bld: 117 mg/dL — ABNORMAL HIGH (ref 70–99)
Potassium: 4.1 mmol/L (ref 3.5–5.1)
Sodium: 139 mmol/L (ref 135–145)

## 2021-05-14 LAB — MAGNESIUM: Magnesium: 1.8 mg/dL (ref 1.7–2.4)

## 2021-05-14 LAB — PHOSPHORUS: Phosphorus: 2.8 mg/dL (ref 2.5–4.6)

## 2021-05-14 MED ORDER — FAMOTIDINE 20 MG PO TABS
20.0000 mg | ORAL_TABLET | Freq: Two times a day (BID) | ORAL | Status: DC
  Administered 2021-05-14 – 2021-05-15 (×3): 20 mg via ORAL
  Filled 2021-05-14 (×3): qty 1

## 2021-05-14 MED ORDER — MAGNESIUM SULFATE 2 GM/50ML IV SOLN
2.0000 g | Freq: Once | INTRAVENOUS | Status: AC
  Administered 2021-05-14: 2 g via INTRAVENOUS
  Filled 2021-05-14: qty 50

## 2021-05-14 NOTE — Progress Notes (Signed)
[facility] ? ?Daily Progress Note ? ? ?Subjective  -  ? ?Still requiring low-dose dopamine.  No major events.  No complaints today ? ?Objective ?Vitals:  ? 05/14/21 1315 05/14/21 1330 05/14/21 1345 05/14/21 1400  ?BP: (!) 111/47 103/61 (!) 98/34 (!) 69/53  ?Pulse: 77 81 80 74  ?Resp:      ?Temp:      ?TempSrc:      ?SpO2: 98% 94% 93% 99%  ?Weight:      ?Height:      ? ? ?Intake/Output Summary (Last 24 hours) at 05/14/2021 1451 ?Last data filed at 05/14/2021 1400 ?Gross per 24 hour  ?Intake 2739.16 ml  ?Output 2175 ml  ?Net 564.16 ml  ? ? ?PULM  CTAB ?CV  RRR ?VASC  access site is clean, dry, and intact ? ?Laboratory ?CBC ?   ?Component Value Date/Time  ? WBC 14.3 (H) 05/13/2021 0436  ? HGB 11.5 (L) 05/13/2021 0436  ? HCT 34.1 (L) 05/13/2021 0436  ? PLT 233 05/13/2021 0436  ? ? ?BMET ?   ?Component Value Date/Time  ? NA 139 05/14/2021 0425  ? K 4.1 05/14/2021 0425  ? CL 107 05/14/2021 0425  ? CO2 26 05/14/2021 0425  ? GLUCOSE 117 (H) 05/14/2021 0425  ? BUN 11 05/14/2021 0425  ? CREATININE 0.87 05/14/2021 0425  ? CALCIUM 8.4 (L) 05/14/2021 0425  ? GFRNONAA >60 05/14/2021 0425  ? GFRAA >60 09/10/2017 2331  ? ? ?Assessment/Planning: ?POD #2 s/p left carotid stent placement ? ?Hypotension still requiring low-dose dopamine.  This has been weaned down to 1-2 mics, but not off. ?We will try to continue to wean the dopamine off tonight and hopefully home tomorrow ?Otherwise doing well ? ? ?Leotis Pain ? ?05/14/2021, 2:51 PM ? ? ? ?  ?

## 2021-05-14 NOTE — Progress Notes (Signed)
PHARMACY CONSULT NOTE ? ?Pharmacy Consult for Electrolyte Monitoring and Replacement  ? ?Recent Labs: ?Potassium (mmol/L)  ?Date Value  ?05/14/2021 4.1  ? ?Magnesium (mg/dL)  ?Date Value  ?05/14/2021 1.8  ? ?Calcium (mg/dL)  ?Date Value  ?05/14/2021 8.4 (L)  ? ?Albumin (g/dL)  ?Date Value  ?09/10/2017 3.8  ? ?Phosphorus (mg/dL)  ?Date Value  ?05/14/2021 2.8  ? ?Sodium (mmol/L)  ?Date Value  ?05/14/2021 139  ? ?Assessment: ?Pharmacy has been consulted to monitor and replace electrolytes in 70yo patient POD2 s/p carotid stent placement. ? ?IVF: NS at 100 mL/hr ? ?Goal of Therapy:  ?Electrolytes within normal limits ? ?Plan:  ?--Hypokalemia resolved, patient continues on Kcl 10 mEq daily ?--Mg 1.8, will go ahead and give IV magnesium sulfate 2 g x 1 ?--Pharmacy will follow along peripherally, labs as ordered per primary team ? ?Benita Gutter ?05/14/2021 7:51 AM ? ?

## 2021-05-14 NOTE — Plan of Care (Signed)
?  Problem: Activity: ?Goal: Ability to return to baseline activity level will improve ?Outcome: Progressing ?  ?Problem: Cardiovascular: ?Goal: Ability to achieve and maintain adequate cardiovascular perfusion will improve ?Outcome: Progressing ?Goal: Vascular access site(s) Level 0-1 will be maintained ?Outcome: Progressing ?  ?Problem: Health Behavior/Discharge Planning: ?Goal: Ability to safely manage health-related needs after discharge will improve ?Outcome: Progressing ?  ?Problem: Education: ?Goal: Knowledge of General Education information will improve ?Description: Including pain rating scale, medication(s)/side effects and non-pharmacologic comfort measures ?Outcome: Progressing ?  ?Problem: Health Behavior/Discharge Planning: ?Goal: Ability to manage health-related needs will improve ?Outcome: Progressing ?  ?Problem: Clinical Measurements: ?Goal: Will remain free from infection ?Outcome: Progressing ?  ?Problem: Activity: ?Goal: Risk for activity intolerance will decrease ?Outcome: Progressing ?  ?

## 2021-05-14 NOTE — Progress Notes (Signed)
PHARMACIST - PHYSICIAN COMMUNICATION ? ?CONCERNING: IV to Oral Route Change Policy ? ?RECOMMENDATION: ?This patient is receiving famotidine by the intravenous route.  Based on criteria approved by the Pharmacy and Therapeutics Committee, the intravenous medication(s) is/are being converted to the equivalent oral dose form(s). ? ? ?DESCRIPTION: ?These criteria include: ?The patient is eating (either orally or via tube) and/or has been taking other orally administered medications for a least 24 hours ?The patient has no evidence of active gastrointestinal bleeding or impaired GI absorption (gastrectomy, short bowel, patient on TNA or NPO). ? ?If you have questions about this conversion, please contact the Pharmacy Department  ? ?Benita Gutter, RPH ?05/14/2021 7:54 AM  ?

## 2021-05-15 ENCOUNTER — Telehealth (INDEPENDENT_AMBULATORY_CARE_PROVIDER_SITE_OTHER): Payer: Self-pay

## 2021-05-15 MED ORDER — ASPIRIN 81 MG PO TBEC: 81.0000 mg | DELAYED_RELEASE_TABLET | Freq: Every day | ORAL | 11 refills | Status: AC

## 2021-05-15 MED ORDER — OXYCODONE-ACETAMINOPHEN 5-325 MG PO TABS: 1.0000 | ORAL_TABLET | Freq: Four times a day (QID) | ORAL | 0 refills | Status: DC | PRN

## 2021-05-15 NOTE — Plan of Care (Signed)
?  Problem: Education: ?Goal: Understanding of CV disease, CV risk reduction, and recovery process will improve ?Outcome: Adequate for Discharge ?Goal: Individualized Educational Video(s) ?Outcome: Adequate for Discharge ?  ?Problem: Activity: ?Goal: Ability to return to baseline activity level will improve ?Outcome: Adequate for Discharge ?  ?Problem: Cardiovascular: ?Goal: Ability to achieve and maintain adequate cardiovascular perfusion will improve ?Outcome: Adequate for Discharge ?Goal: Vascular access site(s) Level 0-1 will be maintained ?Outcome: Adequate for Discharge ?  ?Problem: Health Behavior/Discharge Planning: ?Goal: Ability to safely manage health-related needs after discharge will improve ?Outcome: Adequate for Discharge ?  ?Problem: Education: ?Goal: Knowledge of General Education information will improve ?Description: Including pain rating scale, medication(s)/side effects and non-pharmacologic comfort measures ?Outcome: Adequate for Discharge ?  ?Problem: Health Behavior/Discharge Planning: ?Goal: Ability to manage health-related needs will improve ?Outcome: Adequate for Discharge ?  ?Problem: Clinical Measurements: ?Goal: Will remain free from infection ?Outcome: Adequate for Discharge ?  ?Problem: Activity: ?Goal: Risk for activity intolerance will decrease ?Outcome: Adequate for Discharge ?  ?

## 2021-05-15 NOTE — Progress Notes (Signed)
PT discharged home per order. AVS reviewed with pt and pt's husband at bedside using teach back method. Questions answered. Education provided regarding restrictions, medications, and follow up. PIVs removed. Pt transported to main entrance via wheelchair.  ?

## 2021-05-15 NOTE — Telephone Encounter (Signed)
What sort of work does she do?

## 2021-05-15 NOTE — Discharge Summary (Signed)
?[facility]    ?Discharge Summary ? ? ? ?Patient ID:  ?Kaitlin Miller ?MRN: [id number] ?DOB/AGE: 73-Oct-1952 73 y.o. ? ?Admit date: 05/12/2021 ?Discharge date: 05/15/2021 ?Date of Surgery: 05/12/2021 ?Surgeon: Surgeon(s): ?Algernon Huxley, MD ? ?Admission Diagnosis: ?Carotid stenosis, left [I65.22] ? ?Discharge Diagnoses:  ?Carotid stenosis, left [I65.22] ? ?Secondary Diagnoses: ?Past Medical History:  ?Diagnosis Date  ? Acid reflux   ? Anxiety   ? [facility]) 1980's  ? uterine  ? Hypertension   ? ? ?Procedure(s): ?CAROTID PTA/STENT INTERVENTION ? ?Discharged Condition: good ? ?HPI:  ?Kaitlin Miller is a 71 year old female that presented on 05/12/2021. Following the procedure the patient had some nausea and vomiting with persistent hypotension.  The nausea and vomiting resolved relatively quickly but the hypotension persisted.  Following dopamine weaning, the blood pressure has remained stable with no neurological deficits.  The patient is in good spirits and ready to return home.   ? ?Hospital Course:  ?Kaitlin Miller is a 71 y.o. female is S/P Left Carotid Stent  ?Procedure(s): ?CAROTID PTA/STENT INTERVENTION ?Extubated: POD # 0 ?Physical exam: Neurological intact, soft groin ?Post-op wounds clean, dry, intact or healing well ?Pt. Ambulating, voiding and taking PO diet without difficulty. ?Pt pain controlled with PO pain meds. ?Labs as below ?Complications: hypotension  ? ?Consults:  ? ? ?Significant Diagnostic Studies: ?CBC ?Lab Results  ?Component Value Date  ? WBC 14.3 (H) 05/13/2021  ? HGB 11.5 (L) 05/13/2021  ? HCT 34.1 (L) 05/13/2021  ? MCV 87.9 05/13/2021  ? PLT 233 05/13/2021  ? ? ?BMET ?   ?Component Value Date/Time  ? NA 139 05/14/2021 0425  ? K 4.1 05/14/2021 0425  ? CL 107 05/14/2021 0425  ? CO2 26 05/14/2021 0425  ? GLUCOSE 117 (H) 05/14/2021 0425  ? BUN 11 05/14/2021 0425  ? CREATININE 0.87 05/14/2021 0425  ? CALCIUM 8.4 (L) 05/14/2021 0425  ? GFRNONAA >60 05/14/2021 0425  ?  GFRAA >60 09/10/2017 2331  ? ?COAG ?No results found for: INR, PROTIME ? ? ?Disposition:  ?Discharge to :Home ? ?Allergies as of 05/15/2021   ?No Known Allergies ?  ? ?  ?Medication List  ?  ? ?TAKE these medications   ? ?aspirin 81 MG EC tablet ?Take 1 tablet (81 mg total) by mouth daily at 6 (six) AM. Swallow whole. ?Start taking on: May 16, 2021 ?  ?bisoprolol-hydrochlorothiazide 5-6.25 MG tablet ?Commonly known as: ZIAC ?Take 1 tablet by mouth daily. ?  ?clopidogrel 75 MG tablet ?Commonly known as: PLAVIX ?Take 1 tablet (75 mg total) by mouth daily. ?  ?hydrALAZINE 50 MG tablet ?Commonly known as: APRESOLINE ?TAKE 1.5 TABLETS BY MOUTH 2 TIMES DAILY ?  ?hydrochlorothiazide 25 MG tablet ?Commonly known as: HYDRODIURIL ?Take 25 mg by mouth daily. ?  ?multivitamin tablet ?Take 1 tablet by mouth daily. ?  ?omeprazole 20 MG capsule ?Commonly known as: PRILOSEC ?Take 20 mg by mouth every morning. ?  ?oxyCODONE-acetaminophen 5-325 MG tablet ?Commonly known as: PERCOCET/ROXICET ?Take 1 tablet by mouth every 6 (six) hours as needed for moderate pain. ?  ?potassium chloride SA 20 MEQ tablet ?Commonly known as: KLOR-CON M ?Take by mouth. ?  ?rosuvastatin 10 MG tablet ?Commonly known as: CRESTOR ?Take 10 mg by mouth daily. ?  ?venlafaxine XR 150 MG 24 hr capsule ?Commonly known as: EFFEXOR-XR ?Take 150 mg by mouth daily with breakfast. ?  ?Vitamin D 125 MCG (5000 UT) Caps ?Take 1 capsule by mouth  every morning. ?  ? ?  ? ?Verbal and written Discharge instructions given to the patient. Wound care per Discharge AVS ? Follow-up Information   ? ? Kris Hartmann, NP Follow up in 3 week(s).   ?Specialty: Vascular Surgery ?Why: FB or JD with Carotid in 3 weeks ?Contact information: ?[address] ?[place] [address] ?[phone] ? ? ?  ?  ? ?  ?  ? ?  ? ? ?Signed: ?Kris Hartmann, NP ? ?05/15/2021, 12:55 PM ? ?  ? ?

## 2021-05-16 ENCOUNTER — Telehealth (INDEPENDENT_AMBULATORY_CARE_PROVIDER_SITE_OTHER): Payer: Self-pay

## 2021-05-16 ENCOUNTER — Telehealth (INDEPENDENT_AMBULATORY_CARE_PROVIDER_SITE_OTHER): Payer: Self-pay | Admitting: Vascular Surgery

## 2021-05-16 ENCOUNTER — Encounter (INDEPENDENT_AMBULATORY_CARE_PROVIDER_SITE_OTHER): Payer: Self-pay

## 2021-05-16 ENCOUNTER — Other Ambulatory Visit (INDEPENDENT_AMBULATORY_CARE_PROVIDER_SITE_OTHER): Payer: Self-pay | Admitting: Nurse Practitioner

## 2021-05-16 MED ORDER — OXYCODONE-ACETAMINOPHEN 5-325 MG PO TABS: 1.0000 | ORAL_TABLET | Freq: Four times a day (QID) | ORAL | 0 refills | Status: DC | PRN

## 2021-05-16 NOTE — Telephone Encounter (Signed)
We can send the letter by mail but it may not get there by Monday or we can put it in [place] and she can print it

## 2021-05-16 NOTE — Telephone Encounter (Signed)
Pharmacy called in stating that the medication was deleted on yesterday by the pharmacist because it wasn't needed. Then was made aware it was needed. [facility] called in asking if we could resend the Rx to the pharmacy to fill for the patient  ? ? ?Please call and advise  ?

## 2021-05-16 NOTE — Telephone Encounter (Signed)
Letter completed and patient has been made aware ?

## 2021-05-16 NOTE — Telephone Encounter (Signed)
Patient stated that she sits at a desk working on a computer ?

## 2021-05-16 NOTE — Telephone Encounter (Signed)
Pt. Called and wanted to get a release to work letter from Dr. Lucky Cowboy.  Please advise. ?

## 2021-05-16 NOTE — Telephone Encounter (Signed)
Being work on ?

## 2021-05-16 NOTE — Telephone Encounter (Signed)
sent 

## 2021-06-04 ENCOUNTER — Telehealth (INDEPENDENT_AMBULATORY_CARE_PROVIDER_SITE_OTHER): Payer: Self-pay

## 2021-06-04 NOTE — Telephone Encounter (Signed)
Patient had some questions on is okay to her lift,do any yard work,or have intercourse?. I spoke with Arna Medici NP and the patient is fine to do all. Patient was made aware with medical recomendations ?

## 2021-07-01 ENCOUNTER — Other Ambulatory Visit (INDEPENDENT_AMBULATORY_CARE_PROVIDER_SITE_OTHER): Payer: Self-pay | Admitting: Vascular Surgery

## 2021-07-01 ENCOUNTER — Ambulatory Visit (INDEPENDENT_AMBULATORY_CARE_PROVIDER_SITE_OTHER): Payer: Medicare HMO | Admitting: Vascular Surgery

## 2021-07-01 ENCOUNTER — Ambulatory Visit (INDEPENDENT_AMBULATORY_CARE_PROVIDER_SITE_OTHER): Payer: Medicare HMO

## 2021-07-01 ENCOUNTER — Encounter (INDEPENDENT_AMBULATORY_CARE_PROVIDER_SITE_OTHER): Payer: Self-pay | Admitting: Vascular Surgery

## 2021-07-01 ENCOUNTER — Encounter (INDEPENDENT_AMBULATORY_CARE_PROVIDER_SITE_OTHER): Payer: Medicare HMO | Admitting: Vascular Surgery

## 2021-07-01 VITALS — BP 136/73 | HR 96 | Resp 16 | Wt 142.0 lb

## 2021-07-01 DIAGNOSIS — I6522 Occlusion and stenosis of left carotid artery: Secondary | ICD-10-CM | POA: Diagnosis not present

## 2021-07-01 DIAGNOSIS — E782 Mixed hyperlipidemia: Secondary | ICD-10-CM

## 2021-07-01 DIAGNOSIS — I10 Essential (primary) hypertension: Secondary | ICD-10-CM

## 2021-07-01 DIAGNOSIS — Z959 Presence of cardiac and vascular implant and graft, unspecified: Secondary | ICD-10-CM

## 2021-07-01 DIAGNOSIS — I779 Disorder of arteries and arterioles, unspecified: Secondary | ICD-10-CM

## 2021-07-01 DIAGNOSIS — I6523 Occlusion and stenosis of bilateral carotid arteries: Secondary | ICD-10-CM

## 2021-07-01 NOTE — Assessment & Plan Note (Signed)
Duplex today shows 60 to 79% stenosis in the right ICA with some mildly elevated velocities at the distal edge of the left ICA stent but no obvious high-grade stenosis.  Continue current medical regimen.  Recheck in 3 months with duplex. ?

## 2021-07-01 NOTE — Assessment & Plan Note (Signed)
lipid control important in reducing the progression of atherosclerotic disease. Continue statin therapy  

## 2021-07-01 NOTE — Progress Notes (Signed)
? ? ?Patient ID: Kaitlin Miller, female   DOB: 11-11-1950, 71 y.o.   MRN: [id number] ? ?Chief Complaint  ?Patient presents with  ? Routine Post Op  ?  [facility] 3 week carotid  ? ? ?HPI ?Kaitlin Miller is a 71 y.o. female.  Patient returns in follow-up several weeks status post left carotid stent placement for high-grade stenosis.  She had a somewhat extended stay due to the need for blood pressure support post procedure.  She has done well.  She is gone home without any significant pain.  She had no access related complications.  No speech or swallowing issues.  No focal neurologic deficits.  Duplex today shows 60 to 79% stenosis in the right ICA with some mildly elevated velocities at the distal edge of the left ICA stent but no obvious high-grade stenosis. ? ? ?Past Medical History:  ?Diagnosis Date  ? Acid reflux   ? Anxiety   ? Cancer [facility]) 1980's  ? uterine  ? Hypertension   ? ? ?Past Surgical History:  ?Procedure Laterality Date  ? ABDOMINAL HYSTERECTOMY    ? CAROTID PTA/STENT INTERVENTION Left 05/12/2021  ? Procedure: CAROTID PTA/STENT INTERVENTION;  Surgeon: Algernon Huxley, MD;  Location: [facility];  Service: Cardiovascular;  Laterality: Left;  ? COLONOSCOPY WITH PROPOFOL N/A 12/11/2020  ? Procedure: COLONOSCOPY WITH PROPOFOL;  Surgeon: Toledo, Benay Pike, MD;  Location: [facility] ENDOSCOPY;  Service: Gastroenterology;  Laterality: N/A;  ? [place]  ? ? ? ? ?No Known Allergies ? ?Current Outpatient Medications  ?Medication Sig Dispense Refill  ? aspirin EC 81 MG EC tablet Take 1 tablet (81 mg total) by mouth daily at 6 (six) AM. Swallow whole. 30 tablet 11  ? bisoprolol-hydrochlorothiazide (ZIAC) 5-6.25 MG tablet Take 1 tablet by mouth daily.    ? Cholecalciferol (VITAMIN D) 125 MCG (5000 UT) CAPS Take 1 capsule by mouth every morning.     ? clopidogrel (PLAVIX) 75 MG tablet Take 1 tablet (75 mg total) by mouth daily. 30 tablet 6  ? hydrALAZINE (APRESOLINE) 50 MG tablet TAKE 1.5 TABLETS  BY MOUTH 2 TIMES DAILY    ? hydrochlorothiazide (HYDRODIURIL) 25 MG tablet Take 25 mg by mouth daily.    ? Multiple Vitamin (MULTIVITAMIN) tablet Take 1 tablet by mouth daily.    ? omeprazole (PRILOSEC) 20 MG capsule Take 20 mg by mouth every morning.    ? oxyCODONE-acetaminophen (PERCOCET/ROXICET) 5-325 MG tablet Take 1 tablet by mouth every 6 (six) hours as needed for moderate pain. 20 tablet 0  ? rosuvastatin (CRESTOR) 10 MG tablet Take 10 mg by mouth daily.    ? venlafaxine XR (EFFEXOR-XR) 150 MG 24 hr capsule Take 150 mg by mouth daily with breakfast.    ? potassium chloride SA (K-DUR) 20 MEQ tablet Take by mouth.    ? ?No current facility-administered medications for this visit.  ? ? ? ? ? ? ?Physical Exam ?BP 136/73 (BP Location: Right Arm)   Pulse 96   Resp 16   Wt 142 lb (64.4 kg)   BMI 27.73 kg/m?  ?Gen:  WD/WN, NAD ?Skin: incision C/D/I ? ? ? ? ?Assessment/Plan: ? ?Carotid stenosis ?Duplex today shows 60 to 79% stenosis in the right ICA with some mildly elevated velocities at the distal edge of the left ICA stent but no obvious high-grade stenosis.  Continue current medical regimen.  Recheck in 3 months with duplex. ? ?Benign essential hypertension ?blood pressure control important in  reducing the progression of atherosclerotic disease. On appropriate oral medications. ? ? ?Hyperlipidemia, mixed ?lipid control important in reducing the progression of atherosclerotic disease. Continue statin therapy ? ? ? ? ? ?Leotis Pain ?07/01/2021, 3:20 PM ? ? ?This note was created with Dragon medical transcription system.  Any errors from dictation are unintentional.    ?

## 2021-07-01 NOTE — Assessment & Plan Note (Signed)
blood pressure control important in reducing the progression of atherosclerotic disease. On appropriate oral medications.  

## 2021-09-16 ENCOUNTER — Encounter: Payer: Self-pay | Admitting: Internal Medicine

## 2021-09-17 ENCOUNTER — Ambulatory Visit: Payer: Medicare HMO | Admitting: Anesthesiology

## 2021-09-17 ENCOUNTER — Encounter
Admission: RE | Disposition: A | Discharge status: Home / Self Care | Payer: Self-pay | Source: Ambulatory Visit | Attending: Internal Medicine

## 2021-09-17 ENCOUNTER — Encounter: Payer: Self-pay | Admitting: Internal Medicine

## 2021-09-17 ENCOUNTER — Ambulatory Visit
Admission: RE | Admit: 2021-09-17 | Discharge: 2021-09-17 | Disposition: A | Discharge status: Home / Self Care | Payer: Medicare HMO | Source: Ambulatory Visit | Attending: Internal Medicine | Admitting: Internal Medicine

## 2021-09-17 DIAGNOSIS — K2100 Gastro-esophageal reflux disease with esophagitis, without bleeding: Secondary | ICD-10-CM | POA: Diagnosis not present

## 2021-09-17 DIAGNOSIS — K3189 Other diseases of stomach and duodenum: Secondary | ICD-10-CM | POA: Insufficient documentation

## 2021-09-17 DIAGNOSIS — I10 Essential (primary) hypertension: Secondary | ICD-10-CM | POA: Diagnosis not present

## 2021-09-17 DIAGNOSIS — Z8542 Personal history of malignant neoplasm of other parts of uterus: Secondary | ICD-10-CM | POA: Insufficient documentation

## 2021-09-17 DIAGNOSIS — Z7902 Long term (current) use of antithrombotics/antiplatelets: Secondary | ICD-10-CM | POA: Insufficient documentation

## 2021-09-17 DIAGNOSIS — E785 Hyperlipidemia, unspecified: Secondary | ICD-10-CM | POA: Diagnosis not present

## 2021-09-17 DIAGNOSIS — D509 Iron deficiency anemia, unspecified: Secondary | ICD-10-CM | POA: Insufficient documentation

## 2021-09-17 DIAGNOSIS — K449 Diaphragmatic hernia without obstruction or gangrene: Secondary | ICD-10-CM | POA: Insufficient documentation

## 2021-09-17 DIAGNOSIS — E559 Vitamin D deficiency, unspecified: Secondary | ICD-10-CM | POA: Diagnosis not present

## 2021-09-17 DIAGNOSIS — Z87891 Personal history of nicotine dependence: Secondary | ICD-10-CM | POA: Insufficient documentation

## 2021-09-17 DIAGNOSIS — I6523 Occlusion and stenosis of bilateral carotid arteries: Secondary | ICD-10-CM | POA: Diagnosis not present

## 2021-09-17 HISTORY — DX: Nontoxic multinodular goiter: E04.2

## 2021-09-17 HISTORY — DX: Occlusion and stenosis of unspecified carotid artery: I65.29

## 2021-09-17 HISTORY — DX: Iron deficiency anemia, unspecified: D50.9

## 2021-09-17 HISTORY — PX: ESOPHAGOGASTRODUODENOSCOPY (EGD) WITH PROPOFOL: SHX5813

## 2021-09-17 HISTORY — DX: Panlobular emphysema: J43.1

## 2021-09-17 HISTORY — DX: Other specified disorders of bone density and structure, unspecified site: M85.80

## 2021-09-17 HISTORY — DX: Chronic obstructive pulmonary disease, unspecified: J44.9

## 2021-09-17 HISTORY — DX: Vitamin D deficiency, unspecified: E55.9

## 2021-09-17 HISTORY — DX: Hyperlipidemia, unspecified: E78.5

## 2021-09-17 SURGERY — ESOPHAGOGASTRODUODENOSCOPY (EGD) WITH PROPOFOL
Anesthesia: General

## 2021-09-17 MED ORDER — PROPOFOL 500 MG/50ML IV EMUL
INTRAVENOUS | Status: DC | PRN
  Administered 2021-09-17: 150 ug/kg/min via INTRAVENOUS

## 2021-09-17 MED ORDER — EPHEDRINE 5 MG/ML INJ
INTRAVENOUS | Status: AC
  Filled 2021-09-17: qty 5

## 2021-09-17 MED ORDER — SODIUM CHLORIDE 0.9 % IV SOLN
INTRAVENOUS | Status: DC
  Administered 2021-09-17: 500 mL via INTRAVENOUS

## 2021-09-17 MED ORDER — LIDOCAINE HCL (CARDIAC) PF 100 MG/5ML IV SOSY
PREFILLED_SYRINGE | INTRAVENOUS | Status: DC | PRN
  Administered 2021-09-17: 100 mg via INTRAVENOUS

## 2021-09-17 MED ORDER — PROPOFOL 1000 MG/100ML IV EMUL
INTRAVENOUS | Status: AC
Start: 2021-09-17 — End: ?
  Filled 2021-09-17: qty 100

## 2021-09-17 MED ORDER — PROPOFOL 10 MG/ML IV BOLUS
INTRAVENOUS | Status: DC | PRN
  Administered 2021-09-17: 60 mg via INTRAVENOUS

## 2021-09-17 NOTE — H&P (Signed)
Outpatient short stay form Pre-procedure 09/17/2021 11:54 AM Brandun Pinn K. Alice Reichert, M.D.  Primary Physician: Fulton Reek, M.D.  Reason for visit:  Iron deficiency anemia  History of present illness:   Ms. Eckstrom is a 71 y.o. female presenting for an initial consultation. Diagnoses and all orders for this visit:  Iron deficiency anemia, unspecified iron deficiency anemia type - CBC w/auto Differential (5 Part)  1. IDA-no GI symptoms. Denies obvious blood in stool. Baseline hemoglobin 13 has dropped to 8.4. Drop did occur after beginning Plavix after carotid artery stent placement. Is iron deficient and microcytic. Routinely would do EGD colonoscopy for evaluation but patient just had routine normal colonoscopy 8 months ago. She also just started Plavix March 2023. Will discuss w/ Dr. Alice Reichert but likely plan for diagnostic EGD on plavix for initial evaluation and if no source consider capsule endoscopy for further evaluation.   She has started oral iron a couple weeks ago and is feeling less fatigue. Will repeat CBC today but suspect Hgb to be improving.  2. GERD-symptoms well controlled on low-dose PPI.  I reviewed the risks (including bleeding, perforation, infection, anesthesia complications, cardiac/respiratory complications), benefits and alternatives of EGD+/-Colonoscopy. Patient consents to proceed. Denies CP/SOB/blood thinners.   Addendum - case discussed w/ Dr. Alice Reichert - will plan for EGD ON Plavix. I called pt w/ update. All questions answered. Hgb stable on today's lab at 9.8.       Current Facility-Administered Medications:    0.9 %  sodium chloride infusion, , Intravenous, Continuous, Kobee Medlen, Benay Pike, MD, Last Rate: 20 mL/hr at 09/17/21 1148, 500 mL at 09/17/21 1148  Medications Prior to Admission  Medication Sig Dispense Refill Last Dose   bisoprolol-hydrochlorothiazide (ZIAC) 5-6.25 MG tablet Take 1 tablet by mouth daily.   09/17/2021 at 0800   Cholecalciferol (VITAMIN  D) 125 MCG (5000 UT) CAPS Take 1 capsule by mouth every morning.    Past Week   hydrALAZINE (APRESOLINE) 50 MG tablet TAKE 1.5 TABLETS BY MOUTH 2 TIMES DAILY   09/16/2021   hydrochlorothiazide (HYDRODIURIL) 25 MG tablet Take 25 mg by mouth daily.   09/16/2021   Multiple Vitamin (MULTIVITAMIN) tablet Take 1 tablet by mouth daily.   Past Week   omeprazole (PRILOSEC) 20 MG capsule Take 20 mg by mouth every morning.   09/16/2021   rosuvastatin (CRESTOR) 10 MG tablet Take 10 mg by mouth daily.   09/16/2021   venlafaxine XR (EFFEXOR-XR) 150 MG 24 hr capsule Take 150 mg by mouth daily with breakfast.   09/16/2021   aspirin EC 81 MG EC tablet Take 1 tablet (81 mg total) by mouth daily at 6 (six) AM. Swallow whole. (Patient not taking: Reported on 09/17/2021) 30 tablet 11 Not Taking   clopidogrel (PLAVIX) 75 MG tablet Take 1 tablet (75 mg total) by mouth daily. 30 tablet 6 09/04/2021   oxyCODONE-acetaminophen (PERCOCET/ROXICET) 5-325 MG tablet Take 1 tablet by mouth every 6 (six) hours as needed for moderate pain. (Patient not taking: Reported on 09/17/2021) 20 tablet 0 Not Taking   potassium chloride SA (K-DUR) 20 MEQ tablet Take by mouth.        No Known Allergies   Past Medical History:  Diagnosis Date   Acid reflux    Anxiety    Cancer (HCC) 1980's   uterine   Carotid stenosis    BILATERAL   COPD (chronic obstructive pulmonary disease) (HCC)    HLD (hyperlipidemia)    Hypertension    IDA (iron deficiency anemia)  Multiple thyroid nodules    Osteopenia    Panlobular emphysema (HCC)    Vitamin D deficiency     Review of systems:  Otherwise negative.    Physical Exam  Gen: Alert, oriented. Appears stated age.  HEENT: [place]/AT. PERRLA. Lungs: CTA, no wheezes. CV: RR nl S1, S2. Abd: soft, benign, no masses. BS+ Ext: No edema. Pulses 2+    Planned procedures: Proceed with EGD on Plavix. The patient understands the nature of the planned procedure, indications, risks, alternatives and  potential complications including but not limited to bleeding, infection, perforation, damage to internal organs and possible oversedation/side effects from anesthesia. The patient agrees and gives consent to proceed.  Please refer to procedure notes for findings, recommendations and patient disposition/instructions.     Angeli Demilio K. Alice Reichert, M.D. Gastroenterology 09/17/2021  11:54 AM

## 2021-09-17 NOTE — Anesthesia Postprocedure Evaluation (Deleted)
Anesthesia Post Note  Patient: Kaitlin Miller  Procedure(s) Performed: ESOPHAGOGASTRODUODENOSCOPY (EGD) WITH PROPOFOL  Patient location during evaluation: PACU Anesthesia Type: General Level of consciousness: awake and alert Pain management: pain level controlled Vital Signs Assessment: post-procedure vital signs reviewed and stable Respiratory status: spontaneous breathing, nonlabored ventilation, respiratory function stable and patient connected to nasal cannula oxygen Cardiovascular status: blood pressure returned to baseline and stable Postop Assessment: no apparent nausea or vomiting Anesthetic complications: no   No notable events documented.   Last Vitals:  Vitals:   09/17/21 1307 09/17/21 1317  BP: (!) 125/56 (!) 155/79  Pulse:    Resp:    Temp:    SpO2:      Last Pain:  Vitals:   09/17/21 1317  TempSrc:   PainSc: 0-No pain                 Dimas Millin

## 2021-09-17 NOTE — Anesthesia Preprocedure Evaluation (Signed)
Anesthesia Evaluation  Patient identified by MRN, date of birth, ID band Patient awake    Reviewed: Allergy & Precautions, NPO status , Patient's Chart, lab work & pertinent test results  Airway Mallampati: III  TM Distance: >3 FB Neck ROM: full    Dental  (+) Chipped   Pulmonary neg pulmonary ROS, former smoker,    Pulmonary exam normal        Cardiovascular hypertension, negative cardio ROS Normal cardiovascular exam     Neuro/Psych negative neurological ROS  negative psych ROS   GI/Hepatic negative GI ROS, Neg liver ROS,   Endo/Other  negative endocrine ROS  Renal/GU negative Renal ROS  negative genitourinary   Musculoskeletal   Abdominal   Peds  Hematology negative hematology ROS (+)   Anesthesia Other Findings Past Medical History: No date: Acid reflux No date: Anxiety 1980's: Cancer ([place])     Comment:  uterine No date: Carotid stenosis     Comment:  BILATERAL No date: COPD (chronic obstructive pulmonary disease) (HCC) No date: HLD (hyperlipidemia) No date: Hypertension No date: IDA (iron deficiency anemia) No date: Multiple thyroid nodules No date: Osteopenia No date: Panlobular emphysema (HCC) No date: Vitamin D deficiency  Past Surgical History: No date: ABDOMINAL HYSTERECTOMY 05/12/2021: CAROTID PTA/STENT INTERVENTION; Left     Comment:  Procedure: CAROTID PTA/STENT INTERVENTION;  Surgeon:               Algernon Huxley, MD;  Location: [facility];                Service: Cardiovascular;  Laterality: Left; 12/11/2020: COLONOSCOPY WITH PROPOFOL; N/A     Comment:  Procedure: COLONOSCOPY WITH PROPOFOL;  Surgeon: [place],               Benay Pike, MD;  Location: [facility];  Service:               Gastroenterology;  Laterality: N/A; 1991: LAPAROSCOPIC OVARIAN  BMI    Body Mass Index: 26.51 kg/m      Reproductive/Obstetrics negative OB ROS                              Anesthesia Physical Anesthesia Plan  ASA: 3  Anesthesia Plan: General   Post-op Pain Management: Minimal or no pain anticipated   Induction: Intravenous  PONV Risk Score and Plan: 3 and Propofol infusion, TIVA and Ondansetron  Airway Management Planned: Nasal Cannula  Additional Equipment: None  Intra-op Plan:   Post-operative Plan:   Informed Consent: I have reviewed the patients History and Physical, chart, labs and discussed the procedure including the risks, benefits and alternatives for the proposed anesthesia with the patient or authorized representative who has indicated his/her understanding and acceptance.     Dental advisory given  Plan Discussed with: CRNA and Surgeon  Anesthesia Plan Comments: (Discussed risks of anesthesia with patient, including possibility of difficulty with spontaneous ventilation under anesthesia necessitating airway intervention, PONV, and rare risks such as cardiac or respiratory or neurological events, and allergic reactions. Discussed the role of CRNA in patient's perioperative care. Patient understands.)        Anesthesia Quick Evaluation

## 2021-09-17 NOTE — Anesthesia Postprocedure Evaluation (Signed)
Anesthesia Post Note  Patient: Kaitlin Miller  Procedure(s) Performed: ESOPHAGOGASTRODUODENOSCOPY (EGD) WITH PROPOFOL  Patient location during evaluation: Endoscopy Anesthesia Type: General Level of consciousness: awake and alert Pain management: pain level controlled Vital Signs Assessment: post-procedure vital signs reviewed and stable Respiratory status: spontaneous breathing, nonlabored ventilation, respiratory function stable and patient connected to nasal cannula oxygen Cardiovascular status: blood pressure returned to baseline and stable Postop Assessment: no apparent nausea or vomiting Anesthetic complications: no   No notable events documented.   Last Vitals:  Vitals:   09/17/21 1307 09/17/21 1317  BP: (!) 125/56 (!) 155/79  Pulse:    Resp:    Temp:    SpO2:      Last Pain:  Vitals:   09/17/21 1317  TempSrc:   PainSc: 0-No pain                 Dimas Millin

## 2021-09-17 NOTE — Interval H&P Note (Signed)
History and Physical Interval Note:  09/17/2021 12:05 PM  Kaitlin Miller  has presented today for surgery, with the diagnosis of D50.9 - Iron deficiency anemia, unspecified iron deficiency anemia type Z79.02  - Antiplatelet or antithrombotic long-term use.  The various methods of treatment have been discussed with the patient and family. After consideration of risks, benefits and other options for treatment, the patient has consented to  Procedure(s): ESOPHAGOGASTRODUODENOSCOPY (EGD) WITH PROPOFOL (N/A) as a surgical intervention.  The patient's history has been reviewed, patient examined, no change in status, stable for surgery.  I have reviewed the patient's chart and labs.  Questions were answered to the patient's satisfaction.     [place], [place]

## 2021-09-17 NOTE — Transfer of Care (Signed)
Immediate Anesthesia Transfer of Care Note  Patient: Kaitlin Miller  Procedure(s) Performed: ESOPHAGOGASTRODUODENOSCOPY (EGD) WITH PROPOFOL  Patient Location: Endoscopy Unit  Anesthesia Type:General  Level of Consciousness: drowsy  Airway & Oxygen Therapy: Patient Spontanous Breathing  Post-op Assessment: Report given to RN and Post -op Vital signs reviewed and stable  Post vital signs: Reviewed and stable  Last Vitals:  Vitals Value Taken Time  BP 119/73 09/17/21 1257  Temp    Pulse 75 09/17/21 1257  Resp 18 09/17/21 1257  SpO2 92 % 09/17/21 1257  Vitals shown include unvalidated device data.  Last Pain:  Vitals:   09/17/21 1136  TempSrc: Temporal  PainSc: 0-No pain         Complications: No notable events documented.

## 2021-09-17 NOTE — Op Note (Signed)
[facility] Gastroenterology Patient Name: Kaitlin Miller Procedure Date: 09/17/2021 12:39 PM MRN: [id number] Account #: [id number] Date of Birth: Jan 01, 1951 Admit Type: Outpatient Age: 71 Room: [facility] ENDO ROOM 2 Gender: Female Note Status: Finalized Instrument Name: Upper Endoscope [id number] Procedure:             Upper GI endoscopy Indications:           Unexplained iron deficiency anemia, Gastro-esophageal                         reflux disease Providers:             Benay Pike. Alice Reichert MD, MD Referring MD:          Leonie Douglas. Doy Hutching, MD (Referring MD) Medicines:             Propofol per Anesthesia Complications:         No immediate complications. Procedure:             Pre-Anesthesia Assessment:                        - The risks and benefits of the procedure and the                         sedation options and risks were discussed with the                         patient. All questions were answered and informed                         consent was obtained.                        - Patient identification and proposed procedure were                         verified prior to the procedure by the nurse. The                         procedure was verified in the procedure room.                        - ASA Grade Assessment: III - A patient with severe                         systemic disease.                        - After reviewing the risks and benefits, the patient                         was deemed in satisfactory condition to undergo the                         procedure.                        After obtaining informed consent, the endoscope was                         passed under  direct vision. Throughout the procedure,                         the patient's blood pressure, pulse, and oxygen                         saturations were monitored continuously. The                         Endosonoscope was introduced through the mouth, and                          advanced to the third part of duodenum. The upper GI                         endoscopy was accomplished without difficulty. The                         patient tolerated the procedure well. Findings:      Mucosal changes including feline appearance were found in the entire       esophagus. Biopsies were obtained from the proximal and distal esophagus       with cold forceps for histology of suspected eosinophilic esophagitis.      A 2 cm hiatal hernia was present.      No other significant abnormalities were identified in a careful       examination of the stomach.      The examined duodenum was normal. Biopsies for histology were taken with       a cold forceps for evaluation of celiac disease.      The exam was otherwise without abnormality. Impression:            - Esophageal mucosal changes suggestive of                         eosinophilic esophagitis. Biopsied.                        - 2 cm hiatal hernia.                        - Normal examined duodenum. Biopsied.                        - The examination was otherwise normal. Recommendation:        - Patient has a contact number available for                         emergencies. The signs and symptoms of potential                         delayed complications were discussed with the patient.                         Return to normal activities tomorrow. Written                         discharge instructions were provided to the patient.                        -  Resume previous diet.                        - Continue present medications.                        - Await pathology results.                        - Return to physician assistant at the next available                         appointment.                        - Telephone GI office to schedule appointment.                        - If duodenal biopsies negative for celiac disease,                         will proceed with obtaining a video capsule endoscopy                          of the small intestine. Procedure Code(s):     --- Professional ---                        [phone], Esophagogastroduodenoscopy, flexible,                         transoral; with biopsy, single or multiple Diagnosis Code(s):     --- Professional ---                        K21.9, Gastro-esophageal reflux disease without                         esophagitis                        D50.9, Iron deficiency anemia, unspecified                        K44.9, Diaphragmatic hernia without obstruction or                         gangrene                        K22.8, Other specified diseases of esophagus CPT copyright 2019 American Medical Association. All rights reserved. The codes documented in this report are preliminary and upon coder review may  be revised to meet current compliance requirements. Efrain Sella MD, MD 09/17/2021 12:57:02 PM This report has been signed electronically. Number of Addenda: 0 Note Initiated On: 09/17/2021 12:39 PM Estimated Blood Loss:  Estimated blood loss: none.      [facility]

## 2021-09-18 ENCOUNTER — Encounter: Payer: Self-pay | Admitting: Internal Medicine

## 2021-09-18 LAB — SURGICAL PATHOLOGY

## 2021-10-03 ENCOUNTER — Encounter (INDEPENDENT_AMBULATORY_CARE_PROVIDER_SITE_OTHER): Payer: Self-pay | Admitting: Vascular Surgery

## 2021-10-03 ENCOUNTER — Ambulatory Visit (INDEPENDENT_AMBULATORY_CARE_PROVIDER_SITE_OTHER): Payer: Medicare HMO

## 2021-10-03 ENCOUNTER — Ambulatory Visit (INDEPENDENT_AMBULATORY_CARE_PROVIDER_SITE_OTHER): Payer: Medicare HMO | Admitting: Vascular Surgery

## 2021-10-03 VITALS — BP 145/79 | HR 85 | Resp 16 | Wt 142.6 lb

## 2021-10-03 DIAGNOSIS — I10 Essential (primary) hypertension: Secondary | ICD-10-CM

## 2021-10-03 DIAGNOSIS — E782 Mixed hyperlipidemia: Secondary | ICD-10-CM | POA: Diagnosis not present

## 2021-10-03 DIAGNOSIS — I6523 Occlusion and stenosis of bilateral carotid arteries: Secondary | ICD-10-CM

## 2021-10-03 DIAGNOSIS — K219 Gastro-esophageal reflux disease without esophagitis: Secondary | ICD-10-CM | POA: Diagnosis not present

## 2021-10-03 NOTE — Assessment & Plan Note (Signed)
Carotid duplex today shows no progression from her previous study with 60 to 79% right ICA stenosis.  She does have elevated velocities distal to the left ICA stent that would indicate likely just above the 50% stenosis but this has not progressed either.  This may be due to tortuosity or some degree of kinking beyond the stent. Continue current medical regimen with aspirin, Plavix, and statin agent.  No role for intervention.  Recheck in 3 months.

## 2021-10-03 NOTE — Progress Notes (Signed)
MRN : [id number]  Kaitlin Miller is a 71 y.o. (08/11/1950) female who presents with chief complaint of  Chief Complaint  Patient presents with   Follow-up    Ultrasound follow up  .  History of Present Illness: Patient returns in follow-up of her carotid disease.  She is about 4 months status post left carotid artery stent placement for high-grade stenosis.  She is doing well today.  She is tolerating aspirin, Plavix and statin agent without difficulties.  She has had no focal neurologic deficits. Specifically, the patient denies amaurosis fugax, speech or swallowing difficulties, or arm or leg weakness or numbness Carotid duplex today shows no progression from her previous study with 60 to 79% right ICA stenosis.  She does have elevated velocities distal to the left ICA stent that would indicate likely just above the 50% stenosis but this has not progressed either.  This may be due to tortuosity or some degree of kinking beyond the stent.  Current Outpatient Medications  Medication Sig Dispense Refill   bisoprolol-hydrochlorothiazide (ZIAC) 5-6.25 MG tablet Take 1 tablet by mouth daily.     Cholecalciferol (VITAMIN D) 125 MCG (5000 UT) CAPS Take 1 capsule by mouth every morning.      clopidogrel (PLAVIX) 75 MG tablet Take 1 tablet (75 mg total) by mouth daily. 30 tablet 6   hydrALAZINE (APRESOLINE) 50 MG tablet TAKE 1.5 TABLETS BY MOUTH 2 TIMES DAILY     hydrochlorothiazide (HYDRODIURIL) 25 MG tablet Take 25 mg by mouth daily.     Multiple Vitamin (MULTIVITAMIN) tablet Take 1 tablet by mouth daily.     omeprazole (PRILOSEC) 20 MG capsule Take 20 mg by mouth every morning.     rosuvastatin (CRESTOR) 10 MG tablet Take 10 mg by mouth daily.     venlafaxine XR (EFFEXOR-XR) 150 MG 24 hr capsule Take 150 mg by mouth daily with breakfast.     aspirin EC 81 MG EC tablet Take 1 tablet (81 mg total) by mouth daily at 6 (six) AM. Swallow whole. (Patient not taking: Reported on 09/17/2021) 30  tablet 11   oxyCODONE-acetaminophen (PERCOCET/ROXICET) 5-325 MG tablet Take 1 tablet by mouth every 6 (six) hours as needed for moderate pain. (Patient not taking: Reported on 09/17/2021) 20 tablet 0   potassium chloride SA (K-DUR) 20 MEQ tablet Take by mouth.     No current facility-administered medications for this visit.    Past Medical History:  Diagnosis Date   Acid reflux    Anxiety    Cancer ([place]) 1980's   uterine   Carotid stenosis    BILATERAL   COPD (chronic obstructive pulmonary disease) (HCC)    HLD (hyperlipidemia)    Hypertension    IDA (iron deficiency anemia)    Multiple thyroid nodules    Osteopenia    Panlobular emphysema (HCC)    Vitamin D deficiency     Past Surgical History:  Procedure Laterality Date   ABDOMINAL HYSTERECTOMY     CAROTID PTA/STENT INTERVENTION Left 05/12/2021   Procedure: CAROTID PTA/STENT INTERVENTION;  Surgeon: Algernon Huxley, MD;  Location: [place];  Service: Cardiovascular;  Laterality: Left;   COLONOSCOPY WITH PROPOFOL N/A 12/11/2020   Procedure: COLONOSCOPY WITH PROPOFOL;  Surgeon: Toledo, Benay Pike, MD;  Location: [facility];  Service: Gastroenterology;  Laterality: N/A;   ESOPHAGOGASTRODUODENOSCOPY (EGD) WITH PROPOFOL N/A 09/17/2021   Procedure: ESOPHAGOGASTRODUODENOSCOPY (EGD) WITH PROPOFOL;  Surgeon: Toledo, Benay Pike, MD;  Location: [facility];  Service: Gastroenterology;  Laterality: N/A;   LAPAROSCOPIC OVARIAN  1991      Social History         Tobacco Use   Smoking status: Every Day      Packs/day: 0.50      Years: 50.00      Pack years: 25.00      Types: Cigarettes   Smokeless tobacco: Never  Vaping Use   Vaping Use: Never used  Substance Use Topics   Alcohol use: Not Currently   Drug use: Never             Family History  Problem Relation Age of Onset   Breast cancer Sister 65  No bleeding or clotting disorders No aneurysms   No Known Allergies     REVIEW OF SYSTEMS (Negative unless  checked)   Constitutional: '[]'$ Weight loss  '[]'$ Fever  '[]'$ Chills Cardiac: '[]'$ Chest pain   '[]'$ Chest pressure   '[]'$ Palpitations   '[]'$ Shortness of breath when laying flat   '[]'$ Shortness of breath at rest   '[]'$ Shortness of breath with exertion. Vascular:  '[]'$ Pain in legs with walking   '[]'$ Pain in legs at rest   '[]'$ Pain in legs when laying flat   '[]'$ Claudication   '[]'$ Pain in feet when walking  '[]'$ Pain in feet at rest  '[]'$ Pain in feet when laying flat   '[]'$ History of DVT   '[]'$ Phlebitis   '[]'$ Swelling in legs   '[]'$ Varicose veins   '[]'$ Non-healing ulcers Pulmonary:   '[]'$ Uses home oxygen   '[]'$ Productive cough   '[]'$ Hemoptysis   '[]'$ Wheeze  '[]'$ COPD   '[]'$ Asthma Neurologic:  '[]'$ Dizziness  '[]'$ Blackouts   '[]'$ Seizures   '[]'$ History of stroke   '[]'$ History of TIA  '[]'$ Aphasia   '[]'$ Temporary blindness   '[]'$ Dysphagia   '[]'$ Weakness or numbness in arms   '[]'$ Weakness or numbness in legs Musculoskeletal:  '[x]'$ Arthritis   '[]'$ Joint swelling   '[]'$ Joint pain   '[]'$ Low back pain Hematologic:  '[]'$ Easy bruising  '[]'$ Easy bleeding   '[]'$ Hypercoagulable state   '[]'$ Anemic  '[]'$ Hepatitis Gastrointestinal:  '[]'$ Blood in stool   '[]'$ Vomiting blood  '[]'$ Gastroesophageal reflux/heartburn   '[]'$ Difficulty swallowing. Genitourinary:  '[]'$ Chronic kidney disease   '[]'$ Difficult urination  '[]'$ Frequent urination  '[]'$ Burning with urination   '[]'$ Blood in urine Skin:  '[]'$ Rashes   '[]'$ Ulcers   '[]'$ Wounds Psychological:  '[x]'$ History of anxiety   '[]'$  History of major depression.    Physical Examination  Vitals:   10/03/21 0926  BP: (!) 145/79  Pulse: 85  Resp: 16  Weight: 142 lb 9.6 oz (64.7 kg)   Body mass index is 26.94 kg/m. Gen:  WD/WN, NAD Head: [place]/AT, No temporalis wasting. Ear/Nose/Throat: Hearing grossly intact, nares w/o erythema or drainage, trachea midline Eyes: Conjunctiva clear. Sclera non-icteric Neck: Supple.  Right carotid bruit  Pulmonary:  Good air movement, equal and clear to auscultation bilaterally.  Cardiac: RRR, No JVD Vascular:  Vessel Right Left  Radial Palpable Palpable            Musculoskeletal: M/S 5/5 throughout.  No deformity or atrophy.  No edema. Neurologic: CN 2-12 intact. Sensation grossly intact in extremities.  Symmetrical.  Speech is fluent. Motor exam as listed above. Psychiatric: Judgment intact, Mood & affect appropriate for pt's clinical situation. Dermatologic: No rashes or ulcers noted.  No cellulitis or open wounds.     CBC Lab Results  Component Value Date   WBC 14.3 (H) 05/13/2021   HGB 11.5 (L) 05/13/2021   HCT 34.1 (L) 05/13/2021   MCV 87.9 05/13/2021   PLT 233 05/13/2021  BMET    Component Value Date/Time   NA 139 05/14/2021 0425   K 4.1 05/14/2021 0425   CL 107 05/14/2021 0425   CO2 26 05/14/2021 0425   GLUCOSE 117 (H) 05/14/2021 0425   BUN 11 05/14/2021 0425   CREATININE 0.87 05/14/2021 0425   CALCIUM 8.4 (L) 05/14/2021 0425   GFRNONAA >60 05/14/2021 0425   GFRAA >60 09/10/2017 2331   CrCl cannot be calculated (Patient's most recent lab result is older than the maximum 21 days allowed.).  COAG No results found for: "INR", "PROTIME"  Radiology No results found.   Assessment/Plan Benign essential hypertension blood pressure control important in reducing the progression of atherosclerotic disease. On appropriate oral medications.     GERD (gastroesophageal reflux disease) Continue PPI as already ordered, this medication has been reviewed and there are no changes at this time. Avoidence of caffeine and alcohol Moderate elevation of the head of the bed    Hyperlipidemia, mixed lipid control important in reducing the progression of atherosclerotic disease. Continue statin therapy  Carotid stenosis Carotid duplex today shows no progression from her previous study with 60 to 79% right ICA stenosis.  She does have elevated velocities distal to the left ICA stent that would indicate likely just above the 50% stenosis but this has not progressed either.  This may be due to tortuosity or some degree of kinking beyond  the stent. Continue current medical regimen with aspirin, Plavix, and statin agent.  No role for intervention.  Recheck in 3 months.    Leotis Pain, MD  10/03/2021 10:17 AM    This note was created with Dragon medical transcription system.  Any errors from dictation are purely unintentional

## 2021-10-26 ENCOUNTER — Other Ambulatory Visit (INDEPENDENT_AMBULATORY_CARE_PROVIDER_SITE_OTHER): Payer: Self-pay | Admitting: Vascular Surgery

## 2021-12-26 IMAGING — MG DIGITAL SCREENING BILAT W/ TOMO W/ CAD
8 series · 9 of 24 positions shown · non-contrast
Comparison: Previous exam(s).

CLINICAL DATA: Screening.

EXAM:
DIGITAL SCREENING BILATERAL MAMMOGRAM WITH TOMO AND CAD

[L MLO synth-2D]
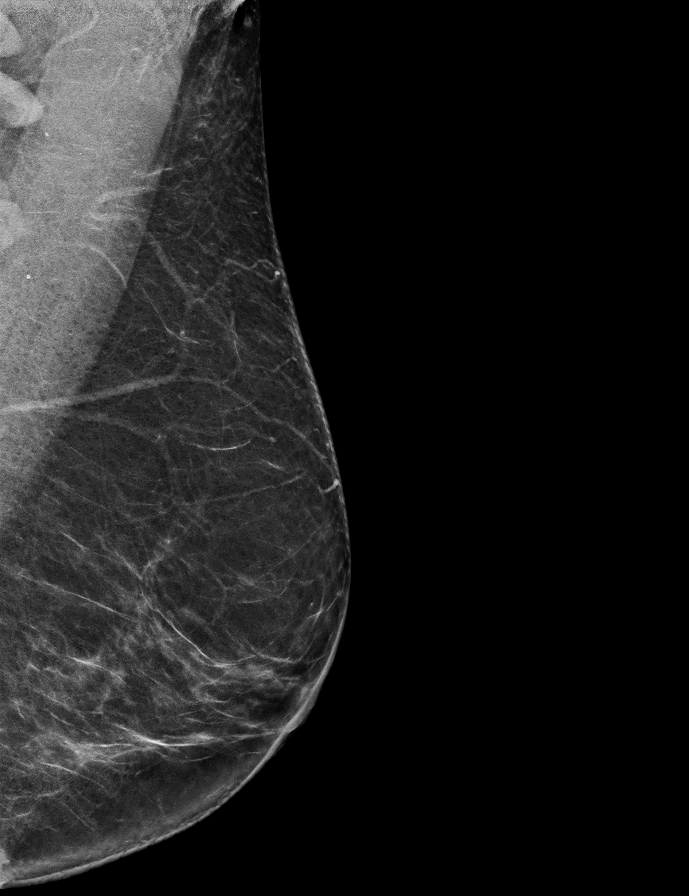

[R MLO synth-2D]
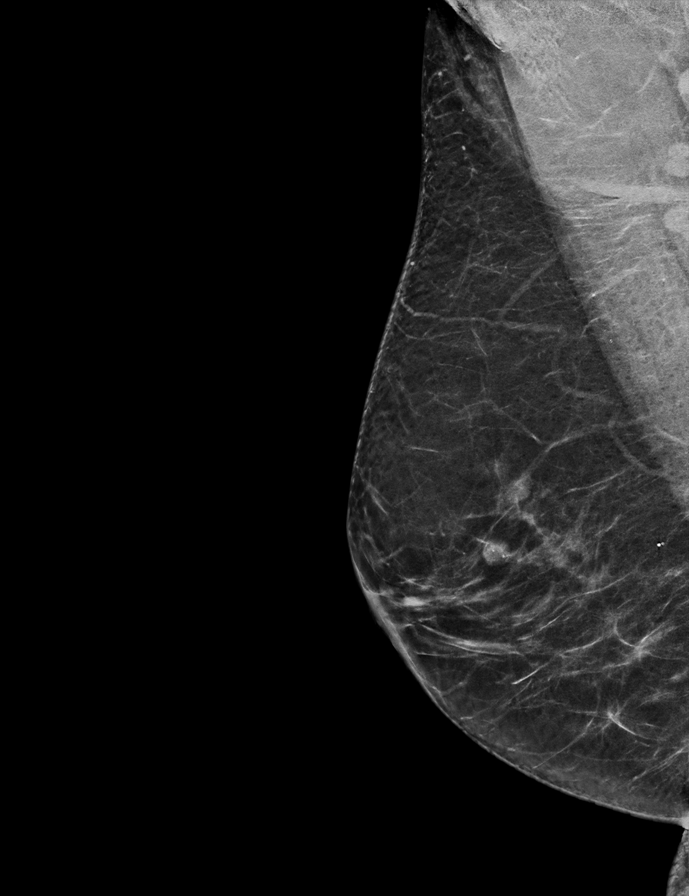

[R CC synth-2D]
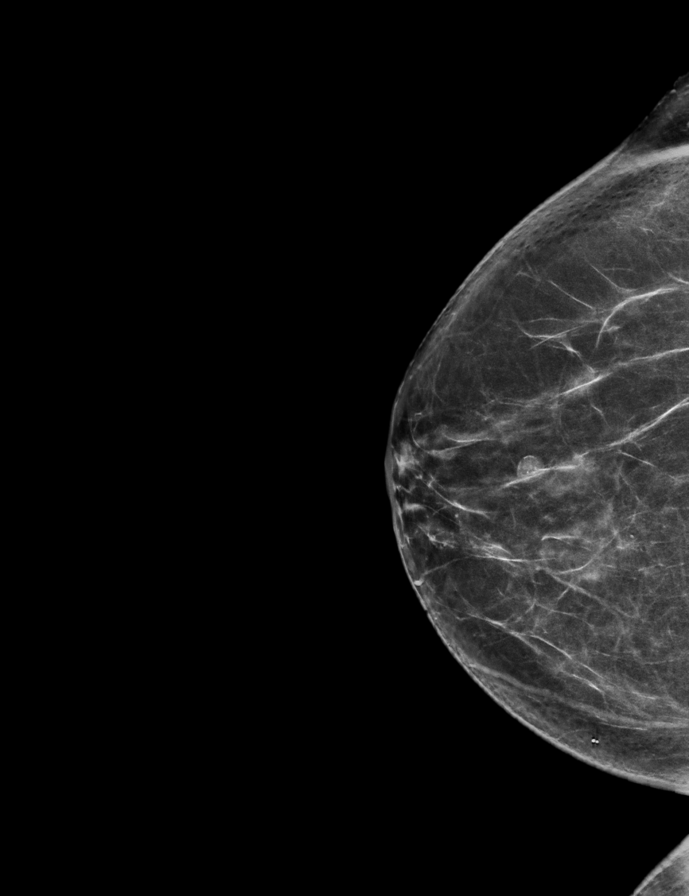

[L CC synth-2D]
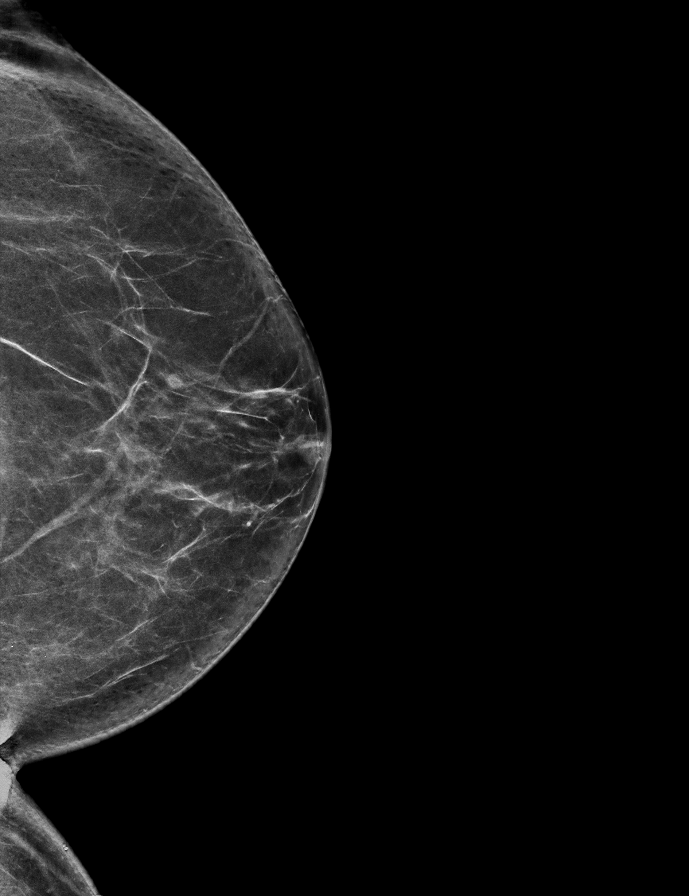

[R MLO tomo · 2 of 60 frames shown]
[frame 20/60]
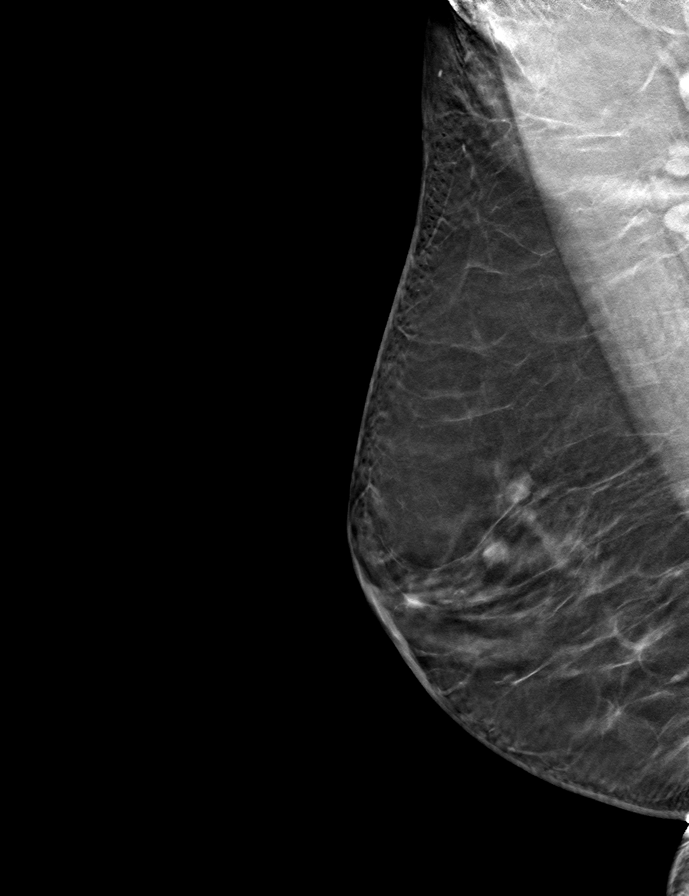
[frame 31/60]
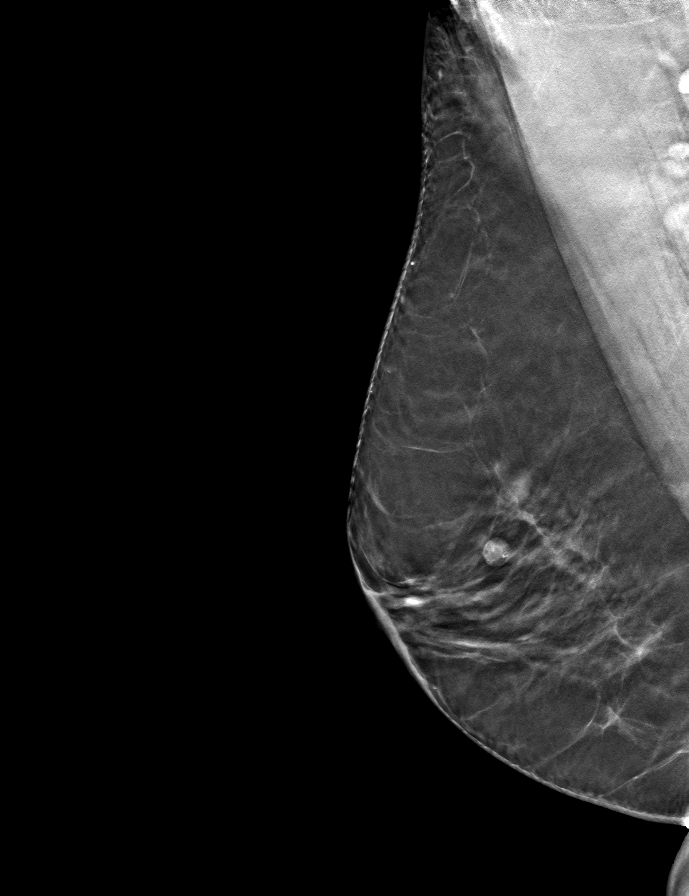

[L CC tomo · tomo slice 33/65.0]
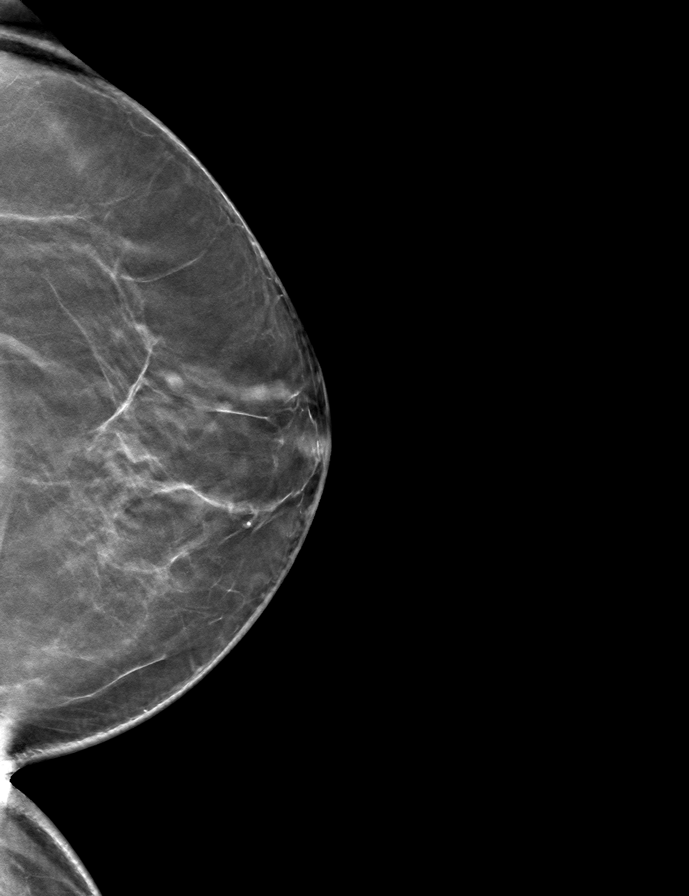

[L MLO tomo · tomo slice 32/63.0]
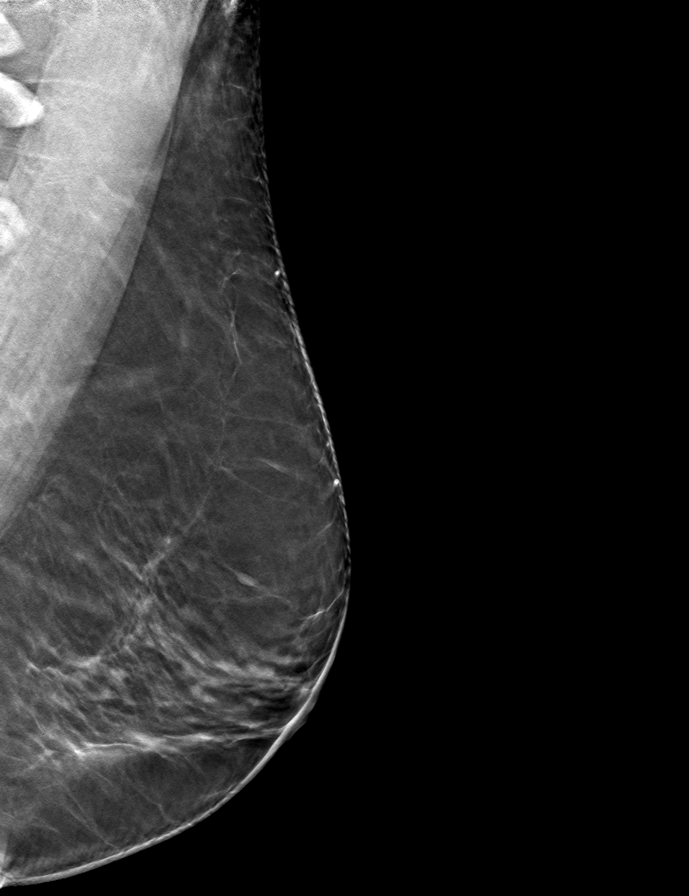

[R CC tomo · tomo slice 32/63.0]
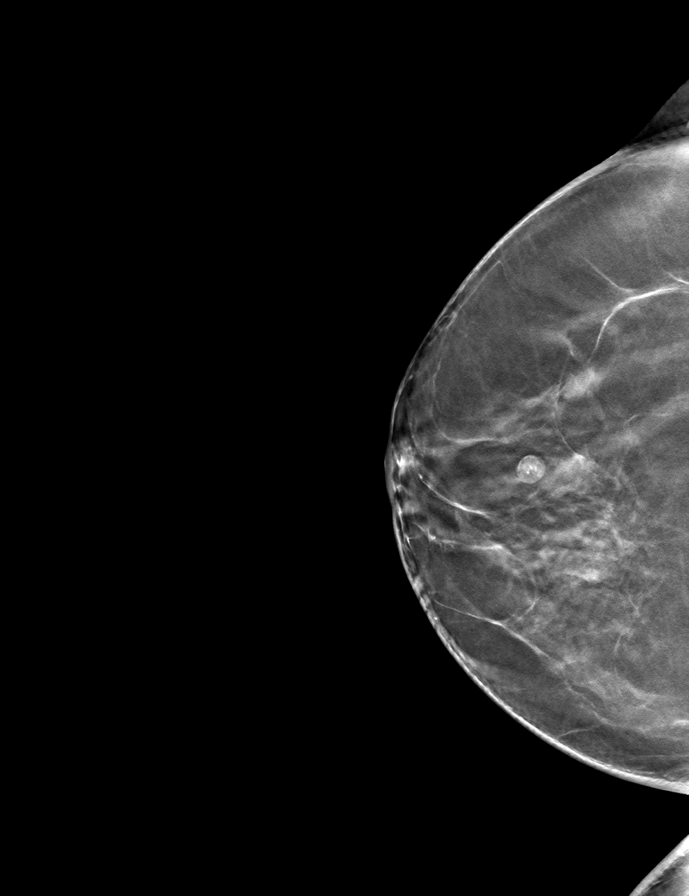

[9 of 24 positions shown; findings below may reference images not displayed]

ACR Breast Density Category b: There are scattered areas of
fibroglandular density.
FINDINGS: There are no findings suspicious for malignancy. Images were
processed with CAD.
IMPRESSION: No mammographic evidence of malignancy. A result letter of this
screening mammogram will be mailed directly to the patient.

RECOMMENDATION:
Screening mammogram in one year. (Code:[id number])

BI-RADS CATEGORY  1: Negative.

## 2021-12-29 ENCOUNTER — Encounter (INDEPENDENT_AMBULATORY_CARE_PROVIDER_SITE_OTHER): Payer: Self-pay

## 2021-12-30 ENCOUNTER — Ambulatory Visit (INDEPENDENT_AMBULATORY_CARE_PROVIDER_SITE_OTHER): Payer: Medicare HMO

## 2021-12-30 ENCOUNTER — Encounter (INDEPENDENT_AMBULATORY_CARE_PROVIDER_SITE_OTHER): Payer: Self-pay | Admitting: Vascular Surgery

## 2021-12-30 ENCOUNTER — Ambulatory Visit (INDEPENDENT_AMBULATORY_CARE_PROVIDER_SITE_OTHER): Payer: Medicare HMO | Admitting: Vascular Surgery

## 2021-12-30 VITALS — BP 129/71 | HR 77 | Resp 17 | Ht 61.0 in | Wt 142.4 lb

## 2021-12-30 DIAGNOSIS — I6523 Occlusion and stenosis of bilateral carotid arteries: Secondary | ICD-10-CM

## 2021-12-30 DIAGNOSIS — I10 Essential (primary) hypertension: Secondary | ICD-10-CM | POA: Diagnosis not present

## 2021-12-30 DIAGNOSIS — K219 Gastro-esophageal reflux disease without esophagitis: Secondary | ICD-10-CM

## 2021-12-30 DIAGNOSIS — E782 Mixed hyperlipidemia: Secondary | ICD-10-CM | POA: Diagnosis not present

## 2021-12-30 DIAGNOSIS — M79605 Pain in left leg: Secondary | ICD-10-CM

## 2021-12-30 DIAGNOSIS — M79609 Pain in unspecified limb: Secondary | ICD-10-CM | POA: Insufficient documentation

## 2021-12-30 NOTE — Assessment & Plan Note (Signed)
The patient describes lower extremity pain that could very well be neurogenic claudication with arterial insufficiency is certainly also a possibility particular in a patient with significant atherosclerotic peripheral arterial disease, previous tobacco use, and multiple other medical atherosclerotic risk factors.  We will obtain ABIs in the near future at her convenience.  We will see her back following the studies.

## 2021-12-30 NOTE — Progress Notes (Signed)
MRN : [id number]  Kaitlin Miller is a 71 y.o. (11-01-1950) female who presents with chief complaint of No chief complaint on file. Marland Kitchen  History of Present Illness: Patient returns in follow-up of her carotid disease.  She is doing well today.  Her biggest complaint currently is of left leg pain particular with activity.  It starts up in the hip and buttock area and radiates down through the thigh and lower leg.  This does not happen at rest.  Is not necessarily positional.  She is not having any focal neurologic symptoms. Specifically, the patient denies amaurosis fugax, speech or swallowing difficulties, or arm or leg weakness or numbness.  Carotid duplex today shows stable velocities on each side with indication of greater than 50% stenosis at the distal edge or just distal to a previously placed left carotid stent but without significant progression.  The right carotid stenosis is stable in the 60 to 79% range.  Current Outpatient Medications  Medication Sig Dispense Refill   aspirin EC 81 MG EC tablet Take 1 tablet (81 mg total) by mouth daily at 6 (six) AM. Swallow whole. 30 tablet 11   Cholecalciferol (VITAMIN D) 125 MCG (5000 UT) CAPS Take 1 capsule by mouth every morning.      clopidogrel (PLAVIX) 75 MG tablet TAKE 1 TABLET BY MOUTH EVERY DAY 90 tablet 2   hydrALAZINE (APRESOLINE) 50 MG tablet TAKE 1.5 TABLETS BY MOUTH 2 TIMES DAILY     hydrochlorothiazide (HYDRODIURIL) 25 MG tablet Take 25 mg by mouth daily.     Multiple Vitamin (MULTIVITAMIN) tablet Take 1 tablet by mouth daily.     omeprazole (PRILOSEC) 20 MG capsule Take 20 mg by mouth every morning.     potassium chloride SA (KLOR-CON M20) 20 MEQ tablet Take 1 tablet by mouth daily.     rosuvastatin (CRESTOR) 10 MG tablet Take 10 mg by mouth daily.     venlafaxine XR (EFFEXOR-XR) 150 MG 24 hr capsule Take 150 mg by mouth daily with breakfast.     No current facility-administered medications for this visit.    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   uterine   Carotid stenosis    BILATERAL   COPD (chronic obstructive pulmonary disease) (HCC)    HLD (hyperlipidemia)    Hypertension    IDA (iron deficiency anemia)    Multiple thyroid nodules    Osteopenia    Panlobular emphysema (HCC)    Vitamin D deficiency     Past Surgical History:  Procedure Laterality Date   ABDOMINAL HYSTERECTOMY     CAROTID PTA/STENT INTERVENTION Left 05/12/2021   Procedure: CAROTID PTA/STENT INTERVENTION;  Surgeon: Algernon Huxley, MD;  Location: [facility];  Service: Cardiovascular;  Laterality: Left;   COLONOSCOPY WITH PROPOFOL N/A 12/11/2020   Procedure: COLONOSCOPY WITH PROPOFOL;  Surgeon: Toledo, Benay Pike, MD;  Location: [facility];  Service: Gastroenterology;  Laterality: N/A;   ESOPHAGOGASTRODUODENOSCOPY (EGD) WITH PROPOFOL N/A 09/17/2021   Procedure: ESOPHAGOGASTRODUODENOSCOPY (EGD) WITH PROPOFOL;  Surgeon: Toledo, Benay Pike, MD;  Location: [facility];  Service: Gastroenterology;  Laterality: N/A;   LAPAROSCOPIC OVARIAN  1991     Social History   Tobacco Use   Smoking status: Former    Packs/day: 0.50    Years: 50.00    Total pack years: 25.00    Types: Cigarettes   Smokeless tobacco: Never  Vaping Use  Vaping Use: Never used  Substance Use Topics   Alcohol use: Never   Drug use: Never       Family History  Problem Relation Age of Onset   Breast cancer Sister 73  No bleeding or clotting disorders No aneurysms  No Known Allergies   REVIEW OF SYSTEMS (Negative unless checked)  Constitutional: '[]'$ Weight loss  '[]'$ Fever  '[]'$ Chills Cardiac: '[]'$ Chest pain   '[]'$ Chest pressure   '[]'$ Palpitations   '[]'$ Shortness of breath when laying flat   '[]'$ Shortness of breath at rest   '[]'$ Shortness of breath with exertion. Vascular:  '[x]'$ Pain in legs with walking   '[]'$ Pain in legs at rest   '[]'$ Pain in legs when laying flat   '[x]'$ Claudication   '[]'$ Pain in feet when  walking  '[]'$ Pain in feet at rest  '[]'$ Pain in feet when laying flat   '[]'$ History of DVT   '[]'$ Phlebitis   '[]'$ Swelling in legs   '[]'$ Varicose veins   '[]'$ Non-healing ulcers Pulmonary:   '[]'$ Uses home oxygen   '[]'$ Productive cough   '[]'$ Hemoptysis   '[]'$ Wheeze  '[]'$ COPD   '[]'$ Asthma Neurologic:  '[]'$ Dizziness  '[]'$ Blackouts   '[]'$ Seizures   '[]'$ History of stroke   '[]'$ History of TIA  '[]'$ Aphasia   '[]'$ Temporary blindness   '[]'$ Dysphagia   '[]'$ Weakness or numbness in arms   '[]'$ Weakness or numbness in legs Musculoskeletal:  '[x]'$ Arthritis   '[]'$ Joint swelling   '[]'$ Joint pain   '[]'$ Low back pain Hematologic:  '[]'$ Easy bruising  '[]'$ Easy bleeding   '[]'$ Hypercoagulable state   '[]'$ Anemic  '[]'$ Hepatitis Gastrointestinal:  '[]'$ Blood in stool   '[]'$ Vomiting blood  '[]'$ Gastroesophageal reflux/heartburn   '[]'$ Difficulty swallowing. Genitourinary:  '[]'$ Chronic kidney disease   '[]'$ Difficult urination  '[]'$ Frequent urination  '[]'$ Burning with urination   '[]'$ Blood in urine Skin:  '[]'$ Rashes   '[]'$ Ulcers   '[]'$ Wounds Psychological:  '[x]'$ History of anxiety   '[]'$  History of major depression.  Physical Examination  Vitals:   12/30/21 0926  BP: 129/71  Pulse: 77  Resp: 17  Weight: 142 lb 6.4 oz (64.6 kg)  Height: '5\' 1"'$  (1.549 m)   Body mass index is 26.91 kg/m. Gen:  WD/WN, NAD Head: [place]/AT, No temporalis wasting. Ear/Nose/Throat: Hearing grossly intact, nares w/o erythema or drainage, trachea midline Eyes: Conjunctiva clear. Sclera non-icteric Neck: Supple.  Soft bilateral carotid bruit  Pulmonary:  Good air movement, equal and clear to auscultation bilaterally.  Cardiac: RRR, No JVD Vascular:  Vessel Right Left  Radial Palpable Palpable   Musculoskeletal: M/S 5/5 throughout.  No deformity or atrophy.  Both feet are warm with good capillary refill.  1+ pedal pulses bilaterally.  No significant lower extremity edema. Neurologic: CN 2-12 intact. Sensation grossly intact in extremities.  Symmetrical.  Speech is fluent. Motor exam as listed above. Psychiatric: Judgment intact, Mood &  affect appropriate for pt's clinical situation. Dermatologic: No rashes or ulcers noted.  No cellulitis or open wounds.     CBC Lab Results  Component Value Date   WBC 14.3 (H) 05/13/2021   HGB 11.5 (L) 05/13/2021   HCT 34.1 (L) 05/13/2021   MCV 87.9 05/13/2021   PLT 233 05/13/2021    BMET    Component Value Date/Time   NA 139 05/14/2021 0425   K 4.1 05/14/2021 0425   CL 107 05/14/2021 0425   CO2 26 05/14/2021 0425   GLUCOSE 117 (H) 05/14/2021 0425   BUN 11 05/14/2021 0425   CREATININE 0.87 05/14/2021 0425   CALCIUM 8.4 (L) 05/14/2021 0425   GFRNONAA >60 05/14/2021 0425   GFRAA >60 09/10/2017  2331   CrCl cannot be calculated (Patient's most recent lab result is older than the maximum 21 days allowed.).  COAG No results found for: "INR", "PROTIME"  Radiology No results found.   Assessment/Plan Benign essential hypertension blood pressure control important in reducing the progression of atherosclerotic disease. On appropriate oral medications.     GERD (gastroesophageal reflux disease) Continue PPI as already ordered, this medication has been reviewed and there are no changes at this time. Avoidence of caffeine and alcohol Moderate elevation of the head of the bed    Hyperlipidemia, mixed lipid control important in reducing the progression of atherosclerotic disease. Continue statin therapy  Pain in limb The patient describes lower extremity pain that could very well be neurogenic claudication with arterial insufficiency is certainly also a possibility particular in a patient with significant atherosclerotic peripheral arterial disease, previous tobacco use, and multiple other medical atherosclerotic risk factors.  We will obtain ABIs in the near future at her convenience.  We will see her back following the studies.  Carotid stenosis Carotid duplex today shows stable velocities on each side with indication of greater than 50% stenosis at the distal edge or just  distal to a previously placed left carotid stent but without significant progression.  The right carotid stenosis is stable in the 60 to 79% range.  Continue current medical regimen with dual antiplatelet therapy and statin agent.  Recheck in 6 months.    Leotis Pain, MD  12/30/2021 11:04 AM    This note was created with Dragon medical transcription system.  Any errors from dictation are purely unintentional

## 2021-12-30 NOTE — Assessment & Plan Note (Signed)
Carotid duplex today shows stable velocities on each side with indication of greater than 50% stenosis at the distal edge or just distal to a previously placed left carotid stent but without significant progression.  The right carotid stenosis is stable in the 60 to 79% range.  Continue current medical regimen with dual antiplatelet therapy and statin agent.  Recheck in 6 months.

## 2022-02-27 ENCOUNTER — Ambulatory Visit (INDEPENDENT_AMBULATORY_CARE_PROVIDER_SITE_OTHER): Payer: Medicare HMO

## 2022-02-27 ENCOUNTER — Ambulatory Visit (INDEPENDENT_AMBULATORY_CARE_PROVIDER_SITE_OTHER): Payer: Medicare HMO | Admitting: Vascular Surgery

## 2022-02-27 DIAGNOSIS — M79605 Pain in left leg: Secondary | ICD-10-CM | POA: Diagnosis not present

## 2022-03-03 ENCOUNTER — Other Ambulatory Visit: Payer: Self-pay | Admitting: Internal Medicine

## 2022-03-03 DIAGNOSIS — Z1231 Encounter for screening mammogram for malignant neoplasm of breast: Secondary | ICD-10-CM

## 2022-04-01 ENCOUNTER — Ambulatory Visit
Admission: RE | Admit: 2022-04-01 | Discharge: 2022-04-01 | Disposition: A | Discharge status: Home / Self Care | Payer: Medicare HMO | Source: Ambulatory Visit | Attending: Internal Medicine | Admitting: Internal Medicine

## 2022-04-01 DIAGNOSIS — Z1231 Encounter for screening mammogram for malignant neoplasm of breast: Secondary | ICD-10-CM | POA: Insufficient documentation

## 2022-05-04 ENCOUNTER — Other Ambulatory Visit (INDEPENDENT_AMBULATORY_CARE_PROVIDER_SITE_OTHER): Payer: Self-pay | Admitting: Vascular Surgery

## 2022-06-30 ENCOUNTER — Ambulatory Visit (INDEPENDENT_AMBULATORY_CARE_PROVIDER_SITE_OTHER): Payer: Medicare HMO

## 2022-06-30 ENCOUNTER — Ambulatory Visit (INDEPENDENT_AMBULATORY_CARE_PROVIDER_SITE_OTHER): Payer: Medicare HMO | Admitting: Vascular Surgery

## 2022-06-30 VITALS — BP 144/72 | HR 80 | Resp 16 | Ht 60.0 in | Wt 133.8 lb

## 2022-06-30 DIAGNOSIS — I6523 Occlusion and stenosis of bilateral carotid arteries: Secondary | ICD-10-CM

## 2022-06-30 DIAGNOSIS — I10 Essential (primary) hypertension: Secondary | ICD-10-CM

## 2022-06-30 DIAGNOSIS — M79605 Pain in left leg: Secondary | ICD-10-CM

## 2022-06-30 DIAGNOSIS — E782 Mixed hyperlipidemia: Secondary | ICD-10-CM | POA: Diagnosis not present

## 2022-06-30 DIAGNOSIS — K219 Gastro-esophageal reflux disease without esophagitis: Secondary | ICD-10-CM | POA: Diagnosis not present

## 2022-06-30 NOTE — Assessment & Plan Note (Signed)
Her duplex today shows improved velocities on the right side now in the 40 to 59% range with a patent left carotid artery stent without significant recurrent stenosis.  Doing well.  A little over a year status post left carotid stent placement.  Continue dual antiplatelet therapy and statin agent.  Recheck in 6 months.

## 2022-06-30 NOTE — Progress Notes (Signed)
MRN : [id number]  Kaitlin Miller is a 72 y.o. ([birth year]) female who presents with chief complaint of  Chief Complaint  Patient presents with   Follow-up  .  History of Present Illness: Patient returns in follow-up.  She is still complaining of leg pain in both lower extremities.  She has been more active since retirement walking a fair bit more.  She had noninvasive studies performed a couple of months ago which demonstrated normal triphasic waveforms with normal ABIs and digital pressures bilaterally consistent with no significant arterial insufficiency. She is primarily followed by [place] for her carotid disease.  A little over a year ago, she underwent treatment of left carotid stenosis with stent placement.  She is doing well.  She denies any focal neurologic symptoms. Specifically, the patient denies amaurosis fugax, speech or swallowing difficulties, or arm or leg weakness or numbness.  She remains on aspirin, Plavix, and statin agent.  Her duplex today shows improved velocities on the right side now in the 40 to 59% range with a patent left carotid artery stent without significant recurrent stenosis.  Current Outpatient Medications  Medication Sig Dispense Refill   aspirin EC 81 MG EC tablet Take 1 tablet (81 mg total) by mouth daily at 6 (six) AM. Swallow whole. 30 tablet 11   Cholecalciferol (VITAMIN D) 125 MCG (5000 UT) CAPS Take 1 capsule by mouth every morning.      clopidogrel (PLAVIX) 75 MG tablet TAKE 1 TABLET BY MOUTH EVERY DAY 90 tablet 2   cyclobenzaprine (FLEXERIL) 10 MG tablet Take by mouth.     hydrALAZINE (APRESOLINE) 50 MG tablet TAKE 1.5 TABLETS BY MOUTH 2 TIMES DAILY     hydrochlorothiazide (HYDRODIURIL) 25 MG tablet Take 25 mg by mouth daily.     Multiple Vitamin (MULTIVITAMIN) tablet Take 1 tablet by mouth daily.     omeprazole (PRILOSEC) 20 MG capsule Take 20 mg by mouth every morning.     potassium chloride SA (KLOR-CON M20) 20 MEQ tablet Take 1 tablet by mouth  daily.     predniSONE (DELTASONE) 10 MG tablet Taper 6-6-5-5-4-4-3-3-2-2-1-1-off     rosuvastatin (CRESTOR) 10 MG tablet Take 10 mg by mouth daily.     venlafaxine XR (EFFEXOR-XR) 150 MG 24 hr capsule Take 150 mg by mouth daily with breakfast.     No current facility-administered medications for this visit.    Past Medical History:  Diagnosis Date   Acid reflux    Anxiety    Cancer (HCC) 1980's   uterine   Carotid stenosis    BILATERAL   COPD (chronic obstructive pulmonary disease) (HCC)    HLD (hyperlipidemia)    Hypertension    IDA (iron deficiency anemia)    Multiple thyroid nodules    Osteopenia    Panlobular emphysema (HCC)    Vitamin D deficiency     Past Surgical History:  Procedure Laterality Date   ABDOMINAL HYSTERECTOMY     CAROTID PTA/STENT INTERVENTION Left 05/12/2021   Procedure: CAROTID PTA/STENT INTERVENTION;  Surgeon: Annice Needy, MD;  Location: [facility];  Service: Cardiovascular;  Laterality: Left;   COLONOSCOPY WITH PROPOFOL N/A 12/11/2020   Procedure: COLONOSCOPY WITH PROPOFOL;  Surgeon: Toledo, Boykin Nearing, MD;  Location: [facility];  Service: Gastroenterology;  Laterality: N/A;   ESOPHAGOGASTRODUODENOSCOPY (EGD) WITH PROPOFOL N/A 09/17/2021   Procedure: ESOPHAGOGASTRODUODENOSCOPY (EGD) WITH PROPOFOL;  Surgeon: Toledo, Boykin Nearing, MD;  Location: [facility];  Service: Gastroenterology;  Laterality: N/A;  LAPAROSCOPIC OVARIAN  1991     Social History   Tobacco Use   Smoking status: Former    Packs/day: 0.50    Years: 50.00    Additional pack years: 0.00    Total pack years: 25.00    Types: Cigarettes   Smokeless tobacco: Never  Vaping Use   Vaping Use: Never used  Substance Use Topics   Alcohol use: Never   Drug use: Never      Family History  Problem Relation Age of Onset   Breast cancer Sister 92     No Known Allergies  REVIEW OF SYSTEMS (Negative unless checked)   Constitutional: [] Weight loss  [] Fever   [] Chills Cardiac: [] Chest pain   [] Chest pressure   [] Palpitations   [] Shortness of breath when laying flat   [] Shortness of breath at rest   [] Shortness of breath with exertion. Vascular:  [x] Pain in legs with walking   [] Pain in legs at rest   [] Pain in legs when laying flat   [x] Claudication   [] Pain in feet when walking  [] Pain in feet at rest  [] Pain in feet when laying flat   [] History of DVT   [] Phlebitis   [] Swelling in legs   [] Varicose veins   [] Non-healing ulcers Pulmonary:   [] Uses home oxygen   [] Productive cough   [] Hemoptysis   [] Wheeze  [] COPD   [] Asthma Neurologic:  [] Dizziness  [] Blackouts   [] Seizures   [] History of stroke   [] History of TIA  [] Aphasia   [] Temporary blindness   [] Dysphagia   [] Weakness or numbness in arms   [] Weakness or numbness in legs Musculoskeletal:  [x] Arthritis   [] Joint swelling   [] Joint pain   [] Low back pain Hematologic:  [] Easy bruising  [] Easy bleeding   [] Hypercoagulable state   [] Anemic  [] Hepatitis Gastrointestinal:  [] Blood in stool   [] Vomiting blood  [] Gastroesophageal reflux/heartburn   [] Difficulty swallowing. Genitourinary:  [] Chronic kidney disease   [] Difficult urination  [] Frequent urination  [] Burning with urination   [] Blood in urine Skin:  [] Rashes   [] Ulcers   [] Wounds Psychological:  [x] History of anxiety   []  History of major depression.  Physical Examination  Vitals:   06/30/22 0856  BP: (!) 144/72  Pulse: 80  Resp: 16  Weight: 133 lb 12.8 oz (60.7 kg)  Height: 5' (1.524 m)   Body mass index is 26.13 kg/m. Gen:  WD/WN, NAD Head: [place]/AT, No temporalis wasting. Ear/Nose/Throat: Hearing grossly intact, nares w/o erythema or drainage, trachea midline Eyes: Conjunctiva clear. Sclera non-icteric Neck: Supple.  No bruit  Pulmonary:  Good air movement, equal and clear to auscultation bilaterally.  Cardiac: RRR, No JVD Vascular:  Vessel Right Left  Radial Palpable Palpable       Musculoskeletal: M/S 5/5 throughout.  No  deformity or atrophy. No edema. Neurologic: CN 2-12 intact. Sensation grossly intact in extremities.  Symmetrical.  Speech is fluent. Motor exam as listed above. Psychiatric: Judgment intact, Mood & affect appropriate for pt's clinical situation. Dermatologic: No rashes or ulcers noted.  No cellulitis or open wounds.    CBC Lab Results  Component Value Date   WBC 14.3 (H) 05/13/2021   HGB 11.5 (L) 05/13/2021   HCT 34.1 (L) 05/13/2021   MCV 87.9 05/13/2021   PLT 233 05/13/2021    BMET    Component Value Date/Time   NA 139 05/14/2021 0425   K 4.1 05/14/2021 0425   CL 107 05/14/2021 0425   CO2 26 05/14/2021 0425  GLUCOSE 117 (H) 05/14/2021 0425   BUN 11 05/14/2021 0425   CREATININE 0.87 05/14/2021 0425   CALCIUM 8.4 (L) 05/14/2021 0425   GFRNONAA >60 05/14/2021 0425   GFRAA >60 09/10/2017 2331   CrCl cannot be calculated (Patient's most recent lab result is older than the maximum 21 days allowed.).  COAG No results found for: "INR", "PROTIME"  Radiology No results found.   Assessment/Plan Pain in limb ABIs were normal  Carotid stenosis Her duplex today shows improved velocities on the right side now in the 40 to 59% range with a patent left carotid artery stent without significant recurrent stenosis.  Doing well.  A little over a year status post left carotid stent placement.  Continue dual antiplatelet therapy and statin agent.  Recheck in 6 months.  Benign essential hypertension blood pressure control important in reducing the progression of atherosclerotic disease. On appropriate oral medications.     GERD (gastroesophageal reflux disease) Continue PPI as already ordered, this medication has been reviewed and there are no changes at this time. Avoidence of caffeine and alcohol Moderate elevation of the head of the bed    Hyperlipidemia, mixed lipid control important in reducing the progression of atherosclerotic disease. Continue statin therapy  Festus Barren,  MD  06/30/2022 9:46 AM    This note was created with Dragon medical transcription system.  Any errors from dictation are purely unintentional

## 2022-06-30 NOTE — Assessment & Plan Note (Signed)
ABIs were normal

## 2022-07-17 ENCOUNTER — Other Ambulatory Visit: Payer: Self-pay | Admitting: Physical Medicine & Rehabilitation

## 2022-07-17 DIAGNOSIS — M5410 Radiculopathy, site unspecified: Secondary | ICD-10-CM

## 2022-07-30 ENCOUNTER — Ambulatory Visit
Admission: RE | Admit: 2022-07-30 | Discharge: 2022-07-30 | Disposition: A | Discharge status: Home / Self Care | Payer: Medicare HMO | Source: Ambulatory Visit | Attending: Physical Medicine & Rehabilitation | Admitting: Physical Medicine & Rehabilitation

## 2022-07-30 DIAGNOSIS — M5410 Radiculopathy, site unspecified: Secondary | ICD-10-CM

## 2022-12-08 ENCOUNTER — Encounter (INDEPENDENT_AMBULATORY_CARE_PROVIDER_SITE_OTHER): Payer: Self-pay

## 2022-12-08 ENCOUNTER — Ambulatory Visit (INDEPENDENT_AMBULATORY_CARE_PROVIDER_SITE_OTHER): Payer: Medicare HMO | Admitting: Vascular Surgery

## 2022-12-08 ENCOUNTER — Ambulatory Visit (INDEPENDENT_AMBULATORY_CARE_PROVIDER_SITE_OTHER): Payer: Medicare HMO

## 2022-12-08 DIAGNOSIS — I6523 Occlusion and stenosis of bilateral carotid arteries: Secondary | ICD-10-CM | POA: Diagnosis not present

## 2023-01-05 ENCOUNTER — Other Ambulatory Visit: Payer: Self-pay | Admitting: Internal Medicine

## 2023-01-05 DIAGNOSIS — N644 Mastodynia: Secondary | ICD-10-CM

## 2023-01-18 ENCOUNTER — Ambulatory Visit
Admission: RE | Admit: 2023-01-18 | Discharge: 2023-01-18 | Disposition: A | Discharge status: Home / Self Care | Payer: Medicare HMO | Source: Ambulatory Visit | Attending: Internal Medicine | Admitting: Internal Medicine

## 2023-01-18 DIAGNOSIS — N644 Mastodynia: Secondary | ICD-10-CM | POA: Diagnosis present

## 2023-04-06 ENCOUNTER — Other Ambulatory Visit: Payer: Self-pay | Admitting: Internal Medicine

## 2023-04-06 DIAGNOSIS — Z1231 Encounter for screening mammogram for malignant neoplasm of breast: Secondary | ICD-10-CM

## 2023-04-13 ENCOUNTER — Ambulatory Visit
Admission: RE | Admit: 2023-04-13 | Discharge: 2023-04-13 | Disposition: A | Discharge status: Home / Self Care | Payer: Medicare HMO | Source: Ambulatory Visit | Attending: Internal Medicine | Admitting: Internal Medicine

## 2023-04-13 DIAGNOSIS — Z1231 Encounter for screening mammogram for malignant neoplasm of breast: Secondary | ICD-10-CM | POA: Diagnosis present

## 2023-04-15 ENCOUNTER — Other Ambulatory Visit (INDEPENDENT_AMBULATORY_CARE_PROVIDER_SITE_OTHER): Payer: Self-pay | Admitting: Vascular Surgery

## 2023-05-13 ENCOUNTER — Other Ambulatory Visit: Payer: Self-pay | Admitting: Cardiology

## 2023-05-13 DIAGNOSIS — R9439 Abnormal result of other cardiovascular function study: Secondary | ICD-10-CM

## 2023-05-13 DIAGNOSIS — R931 Abnormal findings on diagnostic imaging of heart and coronary circulation: Secondary | ICD-10-CM

## 2023-05-18 ENCOUNTER — Encounter (HOSPITAL_COMMUNITY): Payer: Self-pay

## 2023-05-18 ENCOUNTER — Telehealth (HOSPITAL_COMMUNITY): Payer: Self-pay | Admitting: *Deleted

## 2023-05-18 MED ORDER — METOPROLOL TARTRATE 50 MG PO TABS: ORAL_TABLET | ORAL | 0 refills | Status: DC

## 2023-05-18 NOTE — Telephone Encounter (Signed)
 Reaching out to patient to offer assistance regarding upcoming cardiac imaging study; pt verbalizes understanding of appt date/time, parking situation and where to check in, pre-test NPO status and medications ordered, and verified current allergies; name and call back number provided for further questions should they arise  Kaitlin Brick RN Navigator Cardiac Imaging [facility] [phone] office [phone] cell  Patient to take 50mg  metoprolol tartrate two hours prior to her cardiac CT scan.

## 2023-05-20 ENCOUNTER — Ambulatory Visit
Admission: RE | Admit: 2023-05-20 | Discharge: 2023-05-20 | Disposition: A | Discharge status: Home / Self Care | Source: Ambulatory Visit | Attending: Cardiology | Admitting: Cardiology

## 2023-05-20 DIAGNOSIS — I2510 Atherosclerotic heart disease of native coronary artery without angina pectoris: Secondary | ICD-10-CM | POA: Insufficient documentation

## 2023-05-20 DIAGNOSIS — J432 Centrilobular emphysema: Secondary | ICD-10-CM | POA: Insufficient documentation

## 2023-05-20 DIAGNOSIS — R9439 Abnormal result of other cardiovascular function study: Secondary | ICD-10-CM | POA: Insufficient documentation

## 2023-05-20 DIAGNOSIS — R931 Abnormal findings on diagnostic imaging of heart and coronary circulation: Secondary | ICD-10-CM | POA: Diagnosis present

## 2023-05-20 DIAGNOSIS — R9430 Abnormal result of cardiovascular function study, unspecified: Secondary | ICD-10-CM

## 2023-05-20 MED ORDER — SODIUM CHLORIDE 0.9 % IV BOLUS
150.0000 mL | Freq: Once | INTRAVENOUS | Status: AC
  Administered 2023-05-20: 150 mL via INTRAVENOUS

## 2023-05-20 MED ORDER — IOHEXOL 350 MG/ML SOLN
75.0000 mL | Freq: Once | INTRAVENOUS | Status: AC | PRN
  Administered 2023-05-20: 75 mL via INTRAVENOUS

## 2023-05-20 MED ORDER — NITROGLYCERIN 0.4 MG SL SUBL
0.8000 mg | SUBLINGUAL_TABLET | Freq: Once | SUBLINGUAL | Status: AC
  Administered 2023-05-20: 0.8 mg via SUBLINGUAL

## 2023-05-20 MED ORDER — DILTIAZEM HCL 25 MG/5ML IV SOLN: 10.0000 mg | INTRAVENOUS | Status: DC | PRN

## 2023-05-20 MED ORDER — METOPROLOL TARTRATE 5 MG/5ML IV SOLN: 10.0000 mg | Freq: Once | INTRAVENOUS | Status: DC | PRN

## 2023-05-20 NOTE — Progress Notes (Signed)
Patient tolerated procedure well. Ambulate w/o difficulty. Denies any lightheadedness or being dizzy. Pt denies any pain at this time. Sitting in chair, drinking water provided. P is encouraged to drink additional water throughout the day and reason explained to patient. Patient verbalized understanding and all questions answered. ABC intact. No further needs at this time. Discharge from procedure area w/o issues.  °

## 2023-05-24 ENCOUNTER — Other Ambulatory Visit

## 2023-07-20 ENCOUNTER — Encounter (INDEPENDENT_AMBULATORY_CARE_PROVIDER_SITE_OTHER): Payer: Self-pay

## 2023-07-30 ENCOUNTER — Inpatient Hospital Stay
Admission: EM | Admit: 2023-07-30 | Discharge: 2023-08-02 | DRG: 872 | Disposition: A | Discharge status: Home Health | Attending: Internal Medicine | Admitting: Internal Medicine

## 2023-07-30 ENCOUNTER — Emergency Department: Admitting: Radiology

## 2023-07-30 ENCOUNTER — Inpatient Hospital Stay

## 2023-07-30 ENCOUNTER — Other Ambulatory Visit: Payer: Self-pay

## 2023-07-30 ENCOUNTER — Emergency Department

## 2023-07-30 DIAGNOSIS — E871 Hypo-osmolality and hyponatremia: Secondary | ICD-10-CM | POA: Diagnosis present

## 2023-07-30 DIAGNOSIS — E872 Acidosis, unspecified: Secondary | ICD-10-CM | POA: Diagnosis present

## 2023-07-30 DIAGNOSIS — Z8542 Personal history of malignant neoplasm of other parts of uterus: Secondary | ICD-10-CM | POA: Diagnosis not present

## 2023-07-30 DIAGNOSIS — K819 Cholecystitis, unspecified: Secondary | ICD-10-CM | POA: Diagnosis not present

## 2023-07-30 DIAGNOSIS — D509 Iron deficiency anemia, unspecified: Secondary | ICD-10-CM | POA: Diagnosis present

## 2023-07-30 DIAGNOSIS — K810 Acute cholecystitis: Secondary | ICD-10-CM | POA: Insufficient documentation

## 2023-07-30 DIAGNOSIS — R5381 Other malaise: Secondary | ICD-10-CM | POA: Diagnosis present

## 2023-07-30 DIAGNOSIS — Z90710 Acquired absence of both cervix and uterus: Secondary | ICD-10-CM | POA: Diagnosis not present

## 2023-07-30 DIAGNOSIS — I10 Essential (primary) hypertension: Secondary | ICD-10-CM | POA: Diagnosis present

## 2023-07-30 DIAGNOSIS — F419 Anxiety disorder, unspecified: Secondary | ICD-10-CM | POA: Diagnosis present

## 2023-07-30 DIAGNOSIS — E876 Hypokalemia: Secondary | ICD-10-CM | POA: Diagnosis present

## 2023-07-30 DIAGNOSIS — I6529 Occlusion and stenosis of unspecified carotid artery: Secondary | ICD-10-CM | POA: Diagnosis present

## 2023-07-30 DIAGNOSIS — F17210 Nicotine dependence, cigarettes, uncomplicated: Secondary | ICD-10-CM | POA: Diagnosis present

## 2023-07-30 DIAGNOSIS — I2510 Atherosclerotic heart disease of native coronary artery without angina pectoris: Secondary | ICD-10-CM | POA: Diagnosis present

## 2023-07-30 DIAGNOSIS — M8580 Other specified disorders of bone density and structure, unspecified site: Secondary | ICD-10-CM | POA: Diagnosis present

## 2023-07-30 DIAGNOSIS — Z7982 Long term (current) use of aspirin: Secondary | ICD-10-CM

## 2023-07-30 DIAGNOSIS — E785 Hyperlipidemia, unspecified: Secondary | ICD-10-CM | POA: Diagnosis present

## 2023-07-30 DIAGNOSIS — Z7902 Long term (current) use of antithrombotics/antiplatelets: Secondary | ICD-10-CM

## 2023-07-30 DIAGNOSIS — Z803 Family history of malignant neoplasm of breast: Secondary | ICD-10-CM | POA: Diagnosis not present

## 2023-07-30 DIAGNOSIS — Z79899 Other long term (current) drug therapy: Secondary | ICD-10-CM

## 2023-07-30 DIAGNOSIS — K219 Gastro-esophageal reflux disease without esophagitis: Secondary | ICD-10-CM | POA: Diagnosis present

## 2023-07-30 DIAGNOSIS — A419 Sepsis, unspecified organism: Principal | ICD-10-CM | POA: Diagnosis present

## 2023-07-30 DIAGNOSIS — J431 Panlobular emphysema: Secondary | ICD-10-CM | POA: Diagnosis present

## 2023-07-30 DIAGNOSIS — I350 Nonrheumatic aortic (valve) stenosis: Secondary | ICD-10-CM | POA: Diagnosis present

## 2023-07-30 HISTORY — PX: IR PERC CHOLECYSTOSTOMY: IMG2326

## 2023-07-30 LAB — COMPREHENSIVE METABOLIC PANEL WITH GFR
ALT: 22 U/L (ref 0–44)
AST: 38 U/L (ref 15–41)
Albumin: 4.3 g/dL (ref 3.5–5.0)
Alkaline Phosphatase: 63 U/L (ref 38–126)
Anion gap: 17 — ABNORMAL HIGH (ref 5–15)
BUN: 12 mg/dL (ref 8–23)
CO2: 20 mmol/L — ABNORMAL LOW (ref 22–32)
Calcium: 9.6 mg/dL (ref 8.9–10.3)
Chloride: 94 mmol/L — ABNORMAL LOW (ref 98–111)
Creatinine, Ser: 0.9 mg/dL (ref 0.44–1.00)
GFR, Estimated: >60 mL/min (ref >60)
Glucose, Bld: 162 mg/dL — ABNORMAL HIGH (ref 70–99)
Potassium: 3.1 mmol/L — ABNORMAL LOW (ref 3.5–5.1)
Sodium: 131 mmol/L — ABNORMAL LOW (ref 135–145)
Total Bilirubin: 0.6 mg/dL (ref 0.0–1.2)
Total Protein: 7.4 g/dL (ref 6.5–8.1)

## 2023-07-30 LAB — URINALYSIS, ROUTINE W REFLEX MICROSCOPIC
Bacteria, UA: NONE SEEN
Bilirubin Urine: NEGATIVE
Glucose, UA: NEGATIVE mg/dL
Ketones, ur: NEGATIVE mg/dL
Leukocytes,Ua: NEGATIVE
Nitrite: NEGATIVE
Protein, ur: 100 mg/dL — AB
Specific Gravity, Urine: 1.034 — ABNORMAL HIGH (ref 1.005–1.030)
Squamous Epithelial / HPF: 0 /HPF (ref 0–5)
pH: 7 (ref 5.0–8.0)

## 2023-07-30 LAB — CBC
HCT: 47.5 % — ABNORMAL HIGH (ref 36.0–46.0)
Hemoglobin: 16.6 g/dL — ABNORMAL HIGH (ref 12.0–15.0)
MCH: 31.3 pg (ref 26.0–34.0)
MCHC: 34.9 g/dL (ref 30.0–36.0)
MCV: 89.5 fL (ref 80.0–100.0)
Platelets: 280 10*3/uL (ref 150–400)
RBC: 5.31 MIL/uL — ABNORMAL HIGH (ref 3.87–5.11)
RDW: 12.6 % (ref 11.5–15.5)
WBC: 30.4 10*3/uL — ABNORMAL HIGH (ref 4.0–10.5)
nRBC: 0 % (ref 0.0–0.2)

## 2023-07-30 LAB — LACTIC ACID, PLASMA
Lactic Acid, Venous: 3.5 mmol/L (ref 0.5–1.9)
Lactic Acid, Venous: 3.5 mmol/L (ref 0.5–1.9)
Lactic Acid, Venous: 2 mmol/L (ref 0.5–1.9)
Lactic Acid, Venous: 1.6 mmol/L (ref 0.5–1.9)

## 2023-07-30 LAB — MAGNESIUM: Magnesium: 1.7 mg/dL (ref 1.7–2.4)

## 2023-07-30 LAB — LIPASE, BLOOD: Lipase: 30 U/L (ref 11–51)

## 2023-07-30 MED ORDER — ONDANSETRON HCL 4 MG PO TABS: 4.0000 mg | ORAL_TABLET | Freq: Four times a day (QID) | ORAL | Status: DC | PRN

## 2023-07-30 MED ORDER — ROSUVASTATIN CALCIUM 10 MG PO TABS
10.0000 mg | ORAL_TABLET | Freq: Every day | ORAL | Status: DC
  Administered 2023-07-30 – 2023-08-02 (×4): 10 mg via ORAL
  Filled 2023-07-30 (×4): qty 1

## 2023-07-30 MED ORDER — ENOXAPARIN SODIUM 40 MG/0.4ML IJ SOSY: 40.0000 mg | PREFILLED_SYRINGE | INTRAMUSCULAR | Status: DC

## 2023-07-30 MED ORDER — IOHEXOL 300 MG/ML  SOLN
100.0000 mL | Freq: Once | INTRAMUSCULAR | Status: AC | PRN
  Administered 2023-07-30: 100 mL via INTRAVENOUS

## 2023-07-30 MED ORDER — VENLAFAXINE HCL ER 150 MG PO CP24
150.0000 mg | ORAL_CAPSULE | Freq: Every day | ORAL | Status: DC
  Administered 2023-07-31 – 2023-08-02 (×3): 150 mg via ORAL
  Filled 2023-07-30 (×3): qty 1

## 2023-07-30 MED ORDER — HYDROMORPHONE HCL 1 MG/ML IJ SOLN
0.5000 mg | Freq: Once | INTRAMUSCULAR | Status: AC
  Administered 2023-07-30: 0.5 mg via INTRAVENOUS
  Filled 2023-07-30: qty 0.5

## 2023-07-30 MED ORDER — HYDRALAZINE HCL 20 MG/ML IJ SOLN: 5.0000 mg | Freq: Four times a day (QID) | INTRAMUSCULAR | Status: DC | PRN

## 2023-07-30 MED ORDER — POTASSIUM CHLORIDE CRYS ER 20 MEQ PO TBCR
40.0000 meq | EXTENDED_RELEASE_TABLET | Freq: Once | ORAL | Status: AC
  Administered 2023-07-30: 40 meq via ORAL
  Filled 2023-07-30: qty 2

## 2023-07-30 MED ORDER — FENTANYL CITRATE (PF) 100 MCG/2ML IJ SOLN
INTRAMUSCULAR | Status: AC | PRN
  Administered 2023-07-30: 50 ug via INTRAVENOUS

## 2023-07-30 MED ORDER — PIPERACILLIN-TAZOBACTAM 3.375 G IVPB
3.3750 g | Freq: Once | INTRAVENOUS | Status: AC
  Administered 2023-07-30: 3.375 g via INTRAVENOUS
  Filled 2023-07-30: qty 50

## 2023-07-30 MED ORDER — MIDAZOLAM HCL 2 MG/2ML IJ SOLN
INTRAMUSCULAR | Status: AC | PRN
  Administered 2023-07-30: 1 mg via INTRAVENOUS

## 2023-07-30 MED ORDER — ONDANSETRON HCL 4 MG/2ML IJ SOLN
4.0000 mg | Freq: Once | INTRAMUSCULAR | Status: AC
  Administered 2023-07-30: 4 mg via INTRAVENOUS
  Filled 2023-07-30: qty 2

## 2023-07-30 MED ORDER — LIDOCAINE HCL 1 % IJ SOLN
10.0000 mL | Freq: Once | INTRAMUSCULAR | Status: AC
  Administered 2023-07-30: 10 mL via INTRADERMAL

## 2023-07-30 MED ORDER — ONDANSETRON HCL 4 MG/2ML IJ SOLN: 4.0000 mg | Freq: Four times a day (QID) | INTRAMUSCULAR | Status: DC | PRN

## 2023-07-30 MED ORDER — HYDROMORPHONE HCL 1 MG/ML IJ SOLN: 0.5000 mg | INTRAMUSCULAR | Status: DC | PRN

## 2023-07-30 MED ORDER — MIDAZOLAM HCL 2 MG/2ML IJ SOLN
INTRAMUSCULAR | Status: AC
Start: 2023-07-30 — End: ?
  Filled 2023-07-30: qty 2

## 2023-07-30 MED ORDER — ENOXAPARIN SODIUM 40 MG/0.4ML IJ SOSY
40.0000 mg | PREFILLED_SYRINGE | INTRAMUSCULAR | Status: DC
  Administered 2023-07-31 – 2023-08-02 (×3): 40 mg via SUBCUTANEOUS
  Filled 2023-07-30 (×3): qty 0.4

## 2023-07-30 MED ORDER — HYDRALAZINE HCL 50 MG PO TABS
75.0000 mg | ORAL_TABLET | Freq: Two times a day (BID) | ORAL | Status: DC
  Administered 2023-07-31 – 2023-08-02 (×4): 75 mg via ORAL
  Filled 2023-07-30 (×6): qty 1

## 2023-07-30 MED ORDER — LIDOCAINE HCL 1 % IJ SOLN
INTRAMUSCULAR | Status: AC
  Filled 2023-07-30: qty 20

## 2023-07-30 MED ORDER — SODIUM CHLORIDE 0.9% FLUSH
5.0000 mL | Freq: Three times a day (TID) | INTRAVENOUS | Status: DC
  Administered 2023-07-30 – 2023-08-02 (×9): 5 mL

## 2023-07-30 MED ORDER — IOHEXOL 300 MG/ML  SOLN
15.0000 mL | Freq: Once | INTRAMUSCULAR | Status: AC | PRN
  Administered 2023-07-30: 15 mL

## 2023-07-30 MED ORDER — ACETAMINOPHEN 500 MG PO TABS
1000.0000 mg | ORAL_TABLET | Freq: Once | ORAL | Status: AC
  Administered 2023-07-30: 1000 mg via ORAL
  Filled 2023-07-30: qty 2

## 2023-07-30 MED ORDER — LACTATED RINGERS IV BOLUS (SEPSIS)
1000.0000 mL | Freq: Once | INTRAVENOUS | Status: AC
  Administered 2023-07-30: 1000 mL via INTRAVENOUS

## 2023-07-30 MED ORDER — FENTANYL CITRATE (PF) 100 MCG/2ML IJ SOLN
INTRAMUSCULAR | Status: AC
  Filled 2023-07-30: qty 2

## 2023-07-30 MED ORDER — VANCOMYCIN HCL IN DEXTROSE 1-5 GM/200ML-% IV SOLN
1000.0000 mg | Freq: Once | INTRAVENOUS | Status: AC
  Administered 2023-07-30: 1000 mg via INTRAVENOUS
  Filled 2023-07-30: qty 200

## 2023-07-30 MED ORDER — SODIUM CHLORIDE 0.9 % IV BOLUS
1000.0000 mL | Freq: Once | INTRAVENOUS | Status: AC
  Administered 2023-07-30: 1000 mL via INTRAVENOUS

## 2023-07-30 MED ORDER — PIPERACILLIN-TAZOBACTAM 3.375 G IVPB
3.3750 g | Freq: Three times a day (TID) | INTRAVENOUS | Status: DC
  Administered 2023-07-30 – 2023-08-02 (×9): 3.375 g via INTRAVENOUS
  Filled 2023-07-30 (×9): qty 50

## 2023-07-30 MED ORDER — CLOPIDOGREL BISULFATE 75 MG PO TABS
75.0000 mg | ORAL_TABLET | Freq: Every day | ORAL | Status: DC
  Administered 2023-07-31 – 2023-08-02 (×3): 75 mg via ORAL
  Filled 2023-07-30 (×3): qty 1

## 2023-07-30 MED ORDER — ASPIRIN 81 MG PO TBEC
81.0000 mg | DELAYED_RELEASE_TABLET | Freq: Every day | ORAL | Status: DC
  Administered 2023-07-31 – 2023-08-02 (×3): 81 mg via ORAL
  Filled 2023-07-30 (×3): qty 1

## 2023-07-30 MED ORDER — FENTANYL CITRATE PF 50 MCG/ML IJ SOSY
50.0000 ug | PREFILLED_SYRINGE | Freq: Once | INTRAMUSCULAR | Status: AC
  Administered 2023-07-30: 50 ug via INTRAVENOUS
  Filled 2023-07-30: qty 1

## 2023-07-30 MED ORDER — SODIUM CHLORIDE 0.9 % IV SOLN: INTRAVENOUS | Status: DC

## 2023-07-30 MED ORDER — ACETAMINOPHEN 325 MG PO TABS
650.0000 mg | ORAL_TABLET | Freq: Four times a day (QID) | ORAL | Status: DC | PRN
  Administered 2023-07-31 – 2023-08-01 (×4): 650 mg via ORAL
  Filled 2023-07-30 (×4): qty 2

## 2023-07-30 MED ORDER — HYDROCHLOROTHIAZIDE 25 MG PO TABS
25.0000 mg | ORAL_TABLET | Freq: Every day | ORAL | Status: DC
  Administered 2023-07-31 – 2023-08-02 (×3): 25 mg via ORAL
  Filled 2023-07-30 (×3): qty 1

## 2023-07-30 MED ORDER — ACETAMINOPHEN 650 MG RE SUPP: 650.0000 mg | Freq: Four times a day (QID) | RECTAL | Status: DC | PRN

## 2023-07-30 MED ORDER — MORPHINE SULFATE (PF) 4 MG/ML IV SOLN
4.0000 mg | Freq: Once | INTRAVENOUS | Status: AC
  Administered 2023-07-30: 4 mg via INTRAVENOUS
  Filled 2023-07-30 (×2): qty 1

## 2023-07-30 MED ORDER — PANTOPRAZOLE SODIUM 40 MG PO TBEC
40.0000 mg | DELAYED_RELEASE_TABLET | Freq: Every day | ORAL | Status: DC
  Administered 2023-07-30 – 2023-08-02 (×4): 40 mg via ORAL
  Filled 2023-07-30 (×4): qty 1

## 2023-07-30 NOTE — Consult Note (Signed)
 [facility] SURGICAL CONSULTATION NOTE (initial) - cpt: 99244  HISTORY OF PRESENT ILLNESS (HPI):  73 y.o. female presented to [facility] ED today for evaluation of abdominal pain. Patient reports the acute onset of right sided abdominal pain yesterday morning. This progressed throughout the day without resolution. Associated nausea and diaphoresis but no fever, chills, CP, SOB, emesis, bowel changes, nor juandice. She denied any history similar or known gallbladder disease. Previous abdominal surgeries positive for hysterectomy. Of note, she is on Plavix  secondary to history of carotid endarterectomy which she last took yesterday (05/29) morning. Work up in the ED revealed a leukocytosis to 30.4K, Hgb to 16.6, sCr - 0.90, hypokalemia to 3.1, bilirubin normal at 0.6, and venous lactate 3.5 x2. CT Abdomen/Pelvis was obtained and noted gallbladder distension and fluid. RUQ US  also concerning for cholecystitis.   Surgery is consulted by emergency medicine provider Cari Jackolyn Masker, NP physician in this context for evaluation and management acute cholecystitis  PAST MEDICAL HISTORY (PMH):  Past Medical History:  Diagnosis Date   Acid reflux    Anxiety    Cancer (HCC) 1980's   uterine   Carotid stenosis    BILATERAL   COPD (chronic obstructive pulmonary disease) (HCC)    HLD (hyperlipidemia)    Hypertension    IDA (iron deficiency anemia)    Multiple thyroid nodules    Osteopenia    Panlobular emphysema (HCC)    Vitamin D  deficiency      PAST SURGICAL HISTORY (PSH):  Past Surgical History:  Procedure Laterality Date   ABDOMINAL HYSTERECTOMY     CAROTID PTA/STENT INTERVENTION Left 05/12/2021   Procedure: CAROTID PTA/STENT INTERVENTION;  Surgeon: Celso College, MD;  Location: [facility] INVASIVE CV LAB;  Service: Cardiovascular;  Laterality: Left;   COLONOSCOPY WITH PROPOFOL  N/A 12/11/2020   Procedure: COLONOSCOPY WITH PROPOFOL ;  Surgeon: Toledo, Alphonsus Jeans, MD;  Location: [facility]  ENDOSCOPY;  Service: Gastroenterology;  Laterality: N/A;   ESOPHAGOGASTRODUODENOSCOPY (EGD) WITH PROPOFOL  N/A 09/17/2021   Procedure: ESOPHAGOGASTRODUODENOSCOPY (EGD) WITH PROPOFOL ;  Surgeon: Toledo, Alphonsus Jeans, MD;  Location: [facility] ENDOSCOPY;  Service: Gastroenterology;  Laterality: N/A;   LAPAROSCOPIC OVARIAN  1991     MEDICATIONS:  Prior to Admission medications   Medication Sig Start Date End Date Taking? Authorizing Provider  aspirin  EC 81 MG EC tablet Take 1 tablet (81 mg total) by mouth daily at 6 (six) AM. Swallow whole. 05/16/21   Brown, Fallon E, NP  Cholecalciferol  (VITAMIN D ) 125 MCG (5000 UT) CAPS Take 1 capsule by mouth every morning.     [provider]  clopidogrel  (PLAVIX ) 75 MG tablet TAKE 1 TABLET BY MOUTH EVERY DAY 04/15/23   Dew, Jason S, MD  hydrALAZINE  (APRESOLINE ) 50 MG tablet TAKE 1.5 TABLETS BY MOUTH 2 TIMES DAILY 10/14/18   [provider]  hydrochlorothiazide  (HYDRODIURIL ) 25 MG tablet Take 25 mg by mouth daily. 10/24/18   [provider]  metoprolol  tartrate (LOPRESSOR ) 50 MG tablet Take tablet (50mg ) by mouth TWO hours prior to your cardiac CT scan. 05/18/23   Constancia Delton, MD  Multiple Vitamin (MULTIVITAMIN) tablet Take 1 tablet by mouth daily.    [provider]  omeprazole (PRILOSEC) 20 MG capsule Take 20 mg by mouth every morning.    [provider]  potassium chloride  SA (KLOR-CON  M20) 20 MEQ tablet Take 1 tablet by mouth daily. 12/06/21   [provider]  predniSONE (DELTASONE) 10 MG tablet Taper 6-6-5-5-4-4-3-3-2-2-1-1-off 06/24/22   [provider]  rosuvastatin  (CRESTOR )  10 MG tablet Take 10 mg by mouth daily.    [provider]  venlafaxine  XR (EFFEXOR -XR) 150 MG 24 hr capsule Take 150 mg by mouth daily with breakfast.    [provider]     ALLERGIES:  No Known Allergies   SOCIAL HISTORY:  Social History   Socioeconomic History   Marital status: Married    Spouse name:  Roger   Number of children: 2   Years of education: Not on file   Highest education level: Not on file  Occupational History   Not on file  Tobacco Use   Smoking status: Former    Current packs/day: 0.50    Average packs/day: 0.5 packs/day for 50.0 years (25.0 ttl pk-yrs)    Types: Cigarettes   Smokeless tobacco: Never  Vaping Use   Vaping status: Never Used  Substance and Sexual Activity   Alcohol use: Never   Drug use: Never   Sexual activity: Not on file  Other Topics Concern   Not on file  Social History Narrative   Lives at home with spouse   Social Drivers of Health   Financial Resource Strain: Low Risk  (04/13/2023)   Received from [facility]   Overall Financial Resource Strain (CARDIA)    Difficulty of Paying Living Expenses: Not hard at all  Food Insecurity: No Food Insecurity (04/13/2023)   Received from [facility]   Hunger Vital Sign    Worried About Running Out of Food in the Last Year: Never true    Ran Out of Food in the Last Year: Never true  Transportation Needs: No Transportation Needs (04/13/2023)   Received from [facility] - Transportation    In the past 12 months, has lack of transportation kept you from medical appointments or from getting medications?: No    Lack of Transportation (Non-Medical): No  Physical Activity: Not on file  Stress: Not on file  Social Connections: Not on file  Intimate Partner Violence: Not on file     FAMILY HISTORY:  Family History  Problem Relation Age of Onset   Breast cancer Sister 22      REVIEW OF SYSTEMS:  Review of Systems  Constitutional:  Positive for chills and diaphoresis. Negative for fever.  Respiratory:  Negative for cough and shortness of breath.   Cardiovascular:  Negative for chest pain and palpitations.  Gastrointestinal:  Positive for abdominal pain and nausea. Negative for blood in stool, constipation, diarrhea and vomiting.   Genitourinary:  Negative for dysuria and urgency.  All other systems reviewed and are negative.   VITAL SIGNS:  Temp:  [98.3 F (36.8 C)-98.4 F (36.9 C)] 98.4 F (36.9 C) (05/30 1136) Pulse Rate:  [83-95] 83 (05/30 1136) Resp:  [16-18] 16 (05/30 1136) BP: (144-160)/(63-64) 160/64 (05/30 1136) SpO2:  [93 %] 93 % (05/30 1136) Weight:  [58.5 kg] 58.5 kg (05/30 0729)     Height: 5' (152.4 cm) Weight: 58.5 kg BMI (Calculated): 25.19   INTAKE/OUTPUT:  No intake/output data recorded.  PHYSICAL EXAM:  Physical Exam Vitals and nursing note reviewed. Exam conducted with a chaperone present.  Constitutional:      Appearance: She is well-developed. She is ill-appearing.     Comments: Patient resting in bed, appears ill, wet towel on forehead. Family at bedside   HENT:     Head: Normocephalic and atraumatic.  Eyes:     General: No scleral icterus.  Extraocular Movements: Extraocular movements intact.  Cardiovascular:     Rate and Rhythm: Normal rate.     Heart sounds: Normal heart sounds.  Pulmonary:     Effort: Pulmonary effort is normal. No respiratory distress.  Abdominal:     General: Abdomen is flat. A surgical scar is present. There is no distension.     Palpations: Abdomen is soft.     Tenderness: There is abdominal tenderness in the right upper quadrant. There is no guarding or rebound. Positive signs include Murphy's sign.     Comments: Abdomen is soft, she is uncomfortable throughout abdomen but RUQ seems most significant, non-distended.   Genitourinary:    Comments: Deferred Skin:    General: Skin is warm and dry.     Coloration: Skin is not jaundiced.  Neurological:     General: No focal deficit present.     Mental Status: She is alert and oriented to person, place, and time.  Psychiatric:        Mood and Affect: Mood normal.        Behavior: Behavior normal.      Labs:     Latest Ref Rng & Units 07/30/2023    7:33 AM 05/13/2021    4:36 AM 09/10/2017    11:31 PM  CBC  WBC 4.0 - 10.5 K/uL 30.4  14.3  13.6   Hemoglobin 12.0 - 15.0 g/dL 82.4  23.5  36.1   Hematocrit 36.0 - 46.0 % 47.5  34.1  40.7   Platelets 150 - 400 K/uL 280  233  222       Latest Ref Rng & Units 07/30/2023    7:33 AM 05/14/2021    4:25 AM 05/13/2021    5:46 PM  CMP  Glucose 70 - 99 mg/dL 443  154  008   BUN 8 - 23 mg/dL 12  11  11    Creatinine 0.44 - 1.00 mg/dL 6.76  1.95  0.93   Sodium 135 - 145 mmol/L 131  139  134   Potassium 3.5 - 5.1 mmol/L 3.1  4.1  3.3   Chloride 98 - 111 mmol/L 94  107  107   CO2 22 - 32 mmol/L 20  26  24    Calcium  8.9 - 10.3 mg/dL 9.6  8.4  7.7   Total Protein 6.5 - 8.1 g/dL 7.4     Total Bilirubin 0.0 - 1.2 mg/dL 0.6     Alkaline Phos 38 - 126 U/L 63     AST 15 - 41 U/L 38     ALT 0 - 44 U/L 22        Imaging studies:   CT Abdomen/Pelvis (07/30/2023) personally reviewed with distended gallbladder and small amount of surrounding fluid, no other gross intra-abdominal etiology to explain septic presentation, and radiologist report reviewed below:  IMPRESSION: 1. Mild distension of the gallbladder with a small volume of fluid adjacent to the gallbladder tip and adjacent to the liver. Recommend further evaluation by right upper quadrant ultrasound for more sensitive assessment of acute cholecystitis. 2. A small volume of free fluid is present in the pelvis. 3. A moderate volume of stool material is present in the right and transverse colon which may be symptomatic.   RUQ US  (07/30/2023) personally reviewed with dilation of gallbladder and wall thickening, and radiologist report reviewed below:  IMPRESSION: Dilated gallbladder. No obvious stones. Borderline wall thickening. There is also a Murphy's sign reported. With this appearance and the prior CT scan  would recommend further workup such HIDA scan to further delineate or MRCP.   Fatty liver infiltration.  No ductal dilatation.   Assessment/Plan: (ICD-10's: K81.0) 73 y.o.  female with likely acute cholecystitis, complicated by need for anticoagulation (Plavix )   - Appreciate medicine admission  - Unfortunately, given that she is on anti-platelet therapy (Plavix ) concomitant with elevated leukocytosis and lactic acidosis, she is a sub-optimal surgical candidate in this setting. Discussed case with IR who is willing to place cholecystostomy tube; greatly appreciate their help. She, and her family, understand that this drain will need to be maintained for 6-8 weeks minimum prior to considering interval cholecystectomy once recovered from this insult.    - Continue IV Abx  - Monitor abdominal examination - Monitor leukocytosis - Monitor lactic acidosis - Pain control prn; antiemetics prn   - We will follow along   All of the above findings and recommendations were discussed with the patient and her family, and all of their questions were answered to their expressed satisfaction.  Thank you for the opportunity to participate in this patient's care.   Face-to-face time spent with the patient and care providers was 45 minutes, with more than 50% of the time spent counseling, educating, and coordinating care of the patient.     -- Apolonio Bay, PA-C [facility] 07/30/2023, 1:21 PM M-F: 7am - 4pm

## 2023-07-30 NOTE — Progress Notes (Signed)
 Patient clinically stable post IR Chole tube placement per Dr Marne Sings, tolerated well. Denies complaints immediately post procedure. Received Versed  1 mg along with Fentanyl  50 mcg IV for procedure and received Zosyn during procedure. Report given to Jequetta Thomas RN  post procedure/specials/14

## 2023-07-30 NOTE — Consult Note (Signed)
 CODE SEPSIS - PHARMACY COMMUNICATION  **Broad Spectrum Antibiotics should be administered within 1 hour of Sepsis diagnosis**  Time Code Sepsis Called/Page Received: 1610  Antibiotics Ordered: Zosyn   Time of 1st antibiotic administration: 0921  Additional action taken by pharmacy: N/A  Ramonita Burow ,PharmD Clinical Pharmacist  07/30/2023  9:45 AM

## 2023-07-30 NOTE — ED Notes (Signed)
 49 yof with a c/c of right sided abdominal pain since last night. The pt denied any vomiting or diarrhea. The pt is warm, pink, and dry. The pt is alert and oriented x 4. Call bell attached to bed rail. Family at the bedside.

## 2023-07-30 NOTE — ED Provider Notes (Signed)
 Kaitlin Miller    Event Date/Time   First MD Initiated Contact with Patient 07/30/23 [phone]     (approximate)   History   Abdominal Pain   HPI  Kaitlin Miller is a 73 y.o. female with history of GERD, hyperlipidemia, hypertension, COPD and as listed in EMR presents to the emergency department for treatment and evaluation of right side abdominal pain for the past 2 days.  She has had some nausea without vomiting.  No diarrhea.  No fever.  Pain started as gas pressure in the epigastric area last night and is now both right upper and right lower quadrant.  One does not seem worse than the other.  No suprapubic tenderness.  No left-sided tenderness.    Physical Exam   Triage Vital Signs: ED Triage Vitals [07/30/23 0729]  Encounter Vitals Group     BP (!) 144/63     Systolic BP Percentile      Diastolic BP Percentile      Pulse Rate 95     Resp 18     Temp 98.3 F (36.8 C)     Temp Source Oral     SpO2 93 %     Weight 129 lb (58.5 kg)     Height 5' (1.524 m)     Head Circumference      Peak Flow      Pain Score 10     Pain Loc      Pain Education      Exclude from Growth Chart     Most recent vital signs: Vitals:   07/30/23 0729  BP: (!) 144/63  Pulse: 95  Resp: 18  Temp: 98.3 F (36.8 C)  SpO2: 93%    General: Awake, no distress.  CV:  Good peripheral perfusion.  Resp:  Normal effort.  Abd:  No distention.  Soft.  Hypoactive bowel sounds. Other:     ED Results / Procedures / Treatments   Labs (all labs ordered are listed, but only abnormal results are displayed) Labs Reviewed  LIPASE, BLOOD  COMPREHENSIVE METABOLIC PANEL WITH GFR  CBC  URINALYSIS, ROUTINE W REFLEX MICROSCOPIC     EKG  Normal sinus rhythm, rate of 81.   RADIOLOGY  Image and radiology report reviewed and interpreted by me. Radiology report consistent with the same.  CT abdomen and pelvis with contrast shows mild distention of the  gallbladder with a small volume of fluid adjacent to the gallbladder tip and adjacent to the liver.  Further evaluation with ultrasound recommended for further evaluation.  PROCEDURES:  Critical Care performed: Yes  Procedures   MEDICATIONS ORDERED IN ED:  Medications - No data to display   IMPRESSION / MDM / ASSESSMENT AND PLAN / ED COURSE   I have reviewed the triage Miller.  Differential diagnosis includes, but is not limited to, acute cholecystitis, acute appendicitis, choledocholithiasis, colitis  Patient's presentation is most consistent with acute presentation with potential threat to life or bodily function.  The patient is on the cardiac monitor to evaluate for evidence of arrhythmia and/or significant heart rate changes. \\\  73 year old female presenting to the emergency department for treatment and evaluation of right upper and lower abdominal pain with associated nausea.  She denies diarrhea or fever.  See HPI for further details.  Vital signs are reassuring.  Exam is concerning for either acute cholecystitis or appendicitis as that she is exquisitely tender in both right upper and lower abdomen.  CT with contrast ordered.  Awaiting labs.   She is currently on Plavix  for carotid stenosis and her last dose was taken last night.  She has not had anything to eat or drink this morning.  And was advised to remain n.p.o. until results of CT are back.  Clinical Course as of 07/30/23 1346  Fri Jul 30, 2023  1610 Labs show a leukocytosis of 30.4.  Zosyn , lactic acid, and blood cultures ordered.  CMP shows a mild hyponatremia, hypokalemia, chloride of 94, CO2 20, glucose of 162 with an anion gap of 17.  Fluids ordered. [CT]  1207 Patient had a difficult time tolerating the RUQ US  due to pain. Dilaudid  ordered and Medic asked to monitor BP and pulse ox. Saturation dipped to 88% on room air and 2L [place] applied with immediate rise to 96%.  Patient reassessed and updated on results.  Surgical consult requested--secretary to page out. [CT]  1300 Consulted with surgery who will come see her. Awaiting US  report.  [CT]  1329 Kaitlin Beau, PA-C and Dr. Mauri Miller consulting with plan for cystostomy tube to be placed by IR this afternoon. Medical consult for admission requested. [CT]  1345 Patient accepted for admission. [CT]    Clinical Course User Index [CT] Kaitlin Sausedo B, FNP   CRITICAL CARE Performed by: Kaitlin Miller   Total critical care time: 45 minutes  Critical care time was exclusive of separately billable procedures and treating other patients.  Critical care was necessary to treat or prevent imminent or life-threatening deterioration.  Critical care was time spent personally by me on the following activities: development of treatment plan with patient and/or surrogate as well as nursing, discussions with consultants, evaluation of patient's response to treatment, examination of patient, obtaining history from patient or surrogate, ordering and performing treatments and interventions, ordering and review of laboratory studies, ordering and review of radiographic studies, pulse oximetry and re-evaluation of patient's condition.    FINAL CLINICAL IMPRESSION(S) / ED DIAGNOSES   Final diagnoses:  None     Rx / DC Orders   ED Discharge Orders     None        Miller:  This document was prepared using Dragon voice recognition software and may include unintentional dictation errors.   Kaitlin Don, FNP 07/30/23 1347    Kaitlin Darling, MD 07/30/23 [phone]

## 2023-07-30 NOTE — Sepsis Progress Note (Signed)
 eLink is following this Code Sepsis.

## 2023-07-30 NOTE — Procedures (Signed)
 Interventional Radiology Procedure Note  Procedure: Placement of a 31F percutaneous cholecystostomy tube  Complications: None  Estimated Blood Loss: None  Recommendations: - Bile sent for culture - Flush tube TID until sepsis resolved   Signed,  Roxie Cord, MD

## 2023-07-30 NOTE — Consult Note (Signed)
 Chief Complaint: Patient was seen in consultation today for abdominal pain  Referring Physician(s): Freeman Jersey, PA-C  Supervising Physician: Fernando Hoyer  Patient Status: [facility] - In-pt  History of Present Illness: Kaitlin Miller is a 73 y.o. female with history of remote uterine cancer, bilateral carotid stenosis on Plavix , COPD, HTN admitted with abdominal pain. She is afebrile, but with leukocytosis of 30.4, lactic acid 3.5.   CT Abdomen/Pelvis 07/30/23:  IMPRESSION: 1. Mild distension of the gallbladder with a small volume of fluid adjacent to the gallbladder tip and adjacent to the liver. Recommend further evaluation by right upper quadrant ultrasound for more sensitive assessment of acute cholecystitis. 2. A small volume of free fluid is present in the pelvis. 3. A moderate volume of stool material is present in the right and transverse colon which may be symptomatic.  RUQ US  07/30/2023:  IMPRESSION: Dilated gallbladder. No obvious stones. Borderline wall thickening. There is also a Murphy's sign reported. With this appearance and the prior CT scan would recommend further workup such HIDA scan to further delineate or MRCP.  IR consulted for percutaneous cholecystostomy tube placement in patient with acute cholecystitis on anti-platelet therapy.   Patient assessed at bedside.  She is alert and oriented.  She is sleepy but able to demonstrate understanding of her medical condition and the recommended care plan.  She is agreeable to drainage.   Patient is a full code.   Past Medical History:  Diagnosis Date   Acid reflux    Anxiety    Cancer (HCC) 1980's   uterine   Carotid stenosis    BILATERAL   COPD (chronic obstructive pulmonary disease) (HCC)    HLD (hyperlipidemia)    Hypertension    IDA (iron deficiency anemia)    Multiple thyroid nodules    Osteopenia    Panlobular emphysema (HCC)    Vitamin D  deficiency     Past Surgical History:   Procedure Laterality Date   ABDOMINAL HYSTERECTOMY     CAROTID PTA/STENT INTERVENTION Left 05/12/2021   Procedure: CAROTID PTA/STENT INTERVENTION;  Surgeon: Celso College, MD;  Location: [facility] INVASIVE CV LAB;  Service: Cardiovascular;  Laterality: Left;   COLONOSCOPY WITH PROPOFOL  N/A 12/11/2020   Procedure: COLONOSCOPY WITH PROPOFOL ;  Surgeon: Toledo, Alphonsus Jeans, MD;  Location: [facility] ENDOSCOPY;  Service: Gastroenterology;  Laterality: N/A;   ESOPHAGOGASTRODUODENOSCOPY (EGD) WITH PROPOFOL  N/A 09/17/2021   Procedure: ESOPHAGOGASTRODUODENOSCOPY (EGD) WITH PROPOFOL ;  Surgeon: Toledo, Alphonsus Jeans, MD;  Location: [facility] ENDOSCOPY;  Service: Gastroenterology;  Laterality: N/A;   LAPAROSCOPIC OVARIAN  1991    Allergies: Patient has no known allergies.  Medications: Prior to Admission medications   Medication Sig Start Date End Date Taking? Authorizing Provider  aspirin  EC 81 MG EC tablet Take 1 tablet (81 mg total) by mouth daily at 6 (six) AM. Swallow whole. 05/16/21   Brown, Fallon E, NP  Cholecalciferol  (VITAMIN D ) 125 MCG (5000 UT) CAPS Take 1 capsule by mouth every morning.     [provider]  clopidogrel  (PLAVIX ) 75 MG tablet TAKE 1 TABLET BY MOUTH EVERY DAY 04/15/23   Dew, Jason S, MD  hydrALAZINE  (APRESOLINE ) 50 MG tablet TAKE 1.5 TABLETS BY MOUTH 2 TIMES DAILY 10/14/18   [provider]  hydrochlorothiazide  (HYDRODIURIL ) 25 MG tablet Take 25 mg by mouth daily. 10/24/18   [provider]  metoprolol  tartrate (LOPRESSOR ) 50 MG tablet Take tablet (50mg ) by mouth TWO hours prior to your cardiac CT scan. 05/18/23   Constancia Delton,  MD  Multiple Vitamin (MULTIVITAMIN) tablet Take 1 tablet by mouth daily.    [provider]  omeprazole (PRILOSEC) 20 MG capsule Take 20 mg by mouth every morning.    [provider]  potassium chloride  SA (KLOR-CON  M20) 20 MEQ tablet Take 1 tablet by mouth daily. 12/06/21   [provider]  predniSONE (DELTASONE) 10 MG  tablet Taper 6-6-5-5-4-4-3-3-2-2-1-1-off 06/24/22   [provider]  rosuvastatin  (CRESTOR ) 10 MG tablet Take 10 mg by mouth daily.    [provider]  venlafaxine  XR (EFFEXOR -XR) 150 MG 24 hr capsule Take 150 mg by mouth daily with breakfast.    [provider]     Family History  Problem Relation Age of Onset   Breast cancer Sister 3    Social History   Socioeconomic History   Marital status: Married    Spouse name: Geographical information systems officer   Number of children: 2   Years of education: Not on file   Highest education level: Not on file  Occupational History   Not on file  Tobacco Use   Smoking status: Former    Current packs/day: 0.50    Average packs/day: 0.5 packs/day for 50.0 years (25.0 ttl pk-yrs)    Types: Cigarettes   Smokeless tobacco: Never  Vaping Use   Vaping status: Never Used  Substance and Sexual Activity   Alcohol use: Never   Drug use: Never   Sexual activity: Not on file  Other Topics Concern   Not on file  Social History Narrative   Lives at home with spouse   Social Drivers of Health   Financial Resource Strain: Low Risk  (04/13/2023)   Received from [facility]   Overall Financial Resource Strain (CARDIA)    Difficulty of Paying Living Expenses: Not hard at all  Food Insecurity: No Food Insecurity (04/13/2023)   Received from [facility]   Hunger Vital Sign    Worried About Running Out of Food in the Last Year: Never true    Ran Out of Food in the Last Year: Never true  Transportation Needs: No Transportation Needs (04/13/2023)   Received from [facility] - Transportation    In the past 12 months, has lack of transportation kept you from medical appointments or from getting medications?: No    Lack of Transportation (Non-Medical): No  Physical Activity: Not on file  Stress: Not on file  Social Connections: Not on file     Review of Systems: A 12 point ROS discussed and  pertinent positives are indicated in the HPI above.  All other systems are negative.  Review of Systems  Constitutional:  Negative for fatigue and fever.  Respiratory:  Negative for cough and shortness of breath.   Cardiovascular:  Negative for chest pain.  Gastrointestinal:  Positive for abdominal pain and nausea.  Musculoskeletal:  Negative for back pain.  Psychiatric/Behavioral:  Negative for behavioral problems and confusion.     Vital Signs: BP (!) 160/64 (BP Location: Left Arm)   Pulse 83   Temp 98.4 F (36.9 C) (Oral)   Resp 16   Ht 5' (1.524 m)   Wt 129 lb (58.5 kg)   SpO2 93%   BMI 25.19 kg/m   Physical Exam Vitals and nursing note reviewed.  Constitutional:      General: She is not in acute distress.    Appearance: She is well-developed. She is not ill-appearing.  Cardiovascular:  Rate and Rhythm: Normal rate and regular rhythm.  Pulmonary:     Effort: Pulmonary effort is normal. No respiratory distress.  Abdominal:     Tenderness: There is abdominal tenderness (generalized).  Skin:    General: Skin is warm and dry.  Neurological:     General: No focal deficit present.     Mental Status: She is alert and oriented to person, place, and time.      MD Evaluation Airway: WNL Heart: WNL Abdomen: WNL Chest/ Lungs: WNL ASA  Classification: 3 Mallampati/Airway Score: Two   Imaging: US  Abdomen Limited RUQ (LIVER/GB) Result Date: 07/30/2023 CLINICAL DATA:  Right upper quadrant pain EXAM: ULTRASOUND ABDOMEN LIMITED RIGHT UPPER QUADRANT COMPARISON:  CT earlier 07/30/2023. FINDINGS: Gallbladder: Dilated gallbladder. No shadowing stones. Wall is 3 mm in thickness, borderline. The sonographer does report pain when scanning the gallbladder. Common bile duct: Diameter: 5 mm Liver: Echogenic hepatic parenchyma consistent with fatty liver infiltration. Portal vein is patent on color Doppler imaging with normal direction of blood flow towards the liver. Other: None.  IMPRESSION: Dilated gallbladder. No obvious stones. Borderline wall thickening. There is also a Murphy's sign reported. With this appearance and the prior CT scan would recommend further workup such HIDA scan to further delineate or MRCP. Fatty liver infiltration.  No ductal dilatation. Electronically Signed   By: Adrianna Horde M.D.   On: 07/30/2023 13:13   CT ABDOMEN PELVIS W CONTRAST Result Date: 07/30/2023 CLINICAL DATA:  Abdominal pain, acute EXAM: CT ABDOMEN AND PELVIS WITH CONTRAST TECHNIQUE: Multidetector CT imaging of the abdomen and pelvis was performed using the standard protocol following bolus administration of intravenous contrast. RADIATION DOSE REDUCTION: This exam was performed according to the departmental dose-optimization program which includes automated exposure control, adjustment of the mA and/or kV according to patient size and/or use of iterative reconstruction technique. CONTRAST:  OMNIPAQUE  IOHEXOL  300 MG/ML  SOLN COMPARISON:  None Available. FINDINGS: Lower chest: No acute abnormality. Hepatobiliary: No focal liver lesion. The gallbladder is mildly distended. There is a small focus of fluid adjacent to the gallbladder and liver which is annotated. Pancreas: Unremarkable. No pancreatic ductal dilatation or surrounding inflammatory changes. Spleen: Normal in size without focal abnormality. Adrenals/Urinary Tract: Adrenal glands are unremarkable. Kidneys are normal, without renal calculi, focal lesion, or hydronephrosis. Bladder is unremarkable. Stomach/Bowel: The stomach is nondilated. No dilated loops of small bowel are appreciated. The appendix is partially visualized and grossly within normal limits. There is a moderate volume of stool material in the right colon as well as within the transverse colon. Left colon is largely decompressed. Vascular/Lymphatic: Mild diffuse atherosclerotic changes are present in the abdominal aorta and major branch vessels. Reproductive: Status post  hysterectomy. No adnexal masses. Other: A small volume of free fluid is present within the pelvis. A small amount of fluid is also present near the gallbladder tip adjacent to the liver. Musculoskeletal: Mild degenerative changes. Mild anterolisthesis of L5-S1. IMPRESSION: 1. Mild distension of the gallbladder with a small volume of fluid adjacent to the gallbladder tip and adjacent to the liver. Recommend further evaluation by right upper quadrant ultrasound for more sensitive assessment of acute cholecystitis. 2. A small volume of free fluid is present in the pelvis. 3. A moderate volume of stool material is present in the right and transverse colon which may be symptomatic. Electronically Signed   By: Reagan Camera M.D.   On: 07/30/2023 09:52    Labs:  CBC: Recent Labs    07/30/23  0733  WBC 30.4*  HGB 16.6*  HCT 47.5*  PLT 280    COAGS: No results for input(s): "INR", "APTT" in the last 8760 hours.  BMP: Recent Labs    07/30/23 0733  NA 131*  K 3.1*  CL 94*  CO2 20*  GLUCOSE 162*  BUN 12  CALCIUM  9.6  CREATININE 0.90  GFRNONAA >60    LIVER FUNCTION TESTS: Recent Labs    07/30/23 0733  BILITOT 0.6  AST 38  ALT 22  ALKPHOS 63  PROT 7.4  ALBUMIN 4.3    TUMOR MARKERS: No results for input(s): "AFPTM", "CEA", "CA199", "CHROMGRNA" in the last 8760 hours.  Assessment and Plan: Acute cholecystitis Patient with history of bilateral carotid stenosis on Plavix  admitted with new onset abdominal pain.  Imaging findings concerning for acalculous cholecystitis.   She has been evaluated by surgery, however given her comorbidities and anti-platelet therapy, IR consulted for percutaneous cholecystostomy tube placement.  Patient assessed in the ED.   She is alert, oriented but very sleepy.  She is agreeable to drain placement and is able to demonstrate understanding of the care plan and procedure being offered.  She does request her husband sign consent due to acute  illness/weakness.   Risks and benefits discussed with the patient including, but not limited to bleeding, infection, gallbladder perforation, bile leak, sepsis or even death.  All of the patient's questions were answered, patient is agreeable to proceed. Consent signed and in chart.    Thank you for this interesting consult.  I greatly enjoyed meeting CARAGH GASPER and look forward to participating in their care.  A copy of this report was sent to the requesting provider on this date.  Electronically Signed: Jalynn Waddell Sue-Ellen Lianette Broussard, PA 07/30/2023, 2:36 PM   I spent a total of 40 Minutes    in face to face in clinical consultation, greater than 50% of which was counseling/coordinating care for acute cholecystitis.

## 2023-07-30 NOTE — H&P (Signed)
 History and Physical    Kaitlin Miller EAV:[id number] DOB: [birth year] DOA: 07/30/2023  PCP: Yehuda Helms, MD (Confirm with patient/family/NH records and if not entered, this has to be entered at [facility] point of entry) Patient coming from: Home  I have personally briefly reviewed patient's old medical records in [facility]  Chief Complaint: Belly still hurts  HPI: Kaitlin Miller is a 73 y.o. 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  Symptoms started yesterday afternoon, patient started to have cramping-like right-sided abdominal pain associate with nausea diaphoresis but denied any fever chills or vomiting.  No diarrhea or constipation.  Overnight symptoms became more severe and decided to come to hospital.  ED Course: Afebrile, not tachycardia blood pressure 160/64 O2 saturation 94% on 2 L.  CT abdomen pelvis showed inflamed enlarged gallbladder, RUQ ultrasound showed positive signs for Murphy signs implying for acute cholecystitis blood work showed WBC 30.4 hemoglobin 16 sodium 131 potassium 3.1 BUN 12 creatinine 0.9 glucose 162.  Patient was given Zosyn and IV fluid in ED.  Review of Systems: As per HPI otherwise 14 point review of systems negative.    Past Medical History:  Diagnosis Date   Acid reflux    Anxiety    Cancer (HCC) 1980's   uterine   Carotid stenosis    BILATERAL   COPD (chronic obstructive pulmonary disease) (HCC)    HLD (hyperlipidemia)    Hypertension    IDA (iron deficiency anemia)    Multiple thyroid nodules    Osteopenia    Panlobular emphysema (HCC)    Vitamin D  deficiency     Past Surgical History:  Procedure Laterality Date   ABDOMINAL HYSTERECTOMY     CAROTID PTA/STENT INTERVENTION Left 05/12/2021   Procedure: CAROTID PTA/STENT INTERVENTION;  Surgeon: Celso College, MD;   Location: [facility] INVASIVE CV LAB;  Service: Cardiovascular;  Laterality: Left;   COLONOSCOPY WITH PROPOFOL  N/A 12/11/2020   Procedure: COLONOSCOPY WITH PROPOFOL ;  Surgeon: Toledo, Alphonsus Jeans, MD;  Location: [facility] ENDOSCOPY;  Service: Gastroenterology;  Laterality: N/A;   ESOPHAGOGASTRODUODENOSCOPY (EGD) WITH PROPOFOL  N/A 09/17/2021   Procedure: ESOPHAGOGASTRODUODENOSCOPY (EGD) WITH PROPOFOL ;  Surgeon: Toledo, Alphonsus Jeans, MD;  Location: [facility] ENDOSCOPY;  Service: Gastroenterology;  Laterality: N/A;   LAPAROSCOPIC OVARIAN  1991     reports that she has quit smoking. Her smoking use included cigarettes. She has a 25 pack-year smoking history. She has never used smokeless tobacco. She reports that she does not drink alcohol and does not use drugs.  No Known Allergies  Family History  Problem Relation Age of Onset   Breast cancer Sister 51     Prior to Admission medications   Medication Sig Start Date End Date Taking? Authorizing Provider  aspirin  EC 81 MG EC tablet Take 1 tablet (81 mg total) by mouth daily at 6 (six) AM. Swallow whole. 05/16/21   Brown, Fallon E, NP  Cholecalciferol  (VITAMIN D ) 125 MCG (5000 UT) CAPS Take 1 capsule by mouth every morning.     [provider]  clopidogrel  (PLAVIX ) 75 MG tablet TAKE 1 TABLET BY MOUTH EVERY DAY 04/15/23   Celso College, MD  hydrALAZINE  (APRESOLINE ) 50 MG tablet TAKE 1.5 TABLETS BY MOUTH 2 TIMES DAILY 10/14/18   [provider]  hydrochlorothiazide  (HYDRODIURIL ) 25 MG tablet Take 25 mg by mouth daily. 10/24/18   [provider]  metoprolol  tartrate (LOPRESSOR ) 50 MG tablet Take tablet (50mg ) by mouth TWO hours prior to your cardiac CT scan. 05/18/23   Constancia Delton, MD  Multiple Vitamin (MULTIVITAMIN) tablet Take 1 tablet by mouth daily.    [provider]  omeprazole (PRILOSEC) 20 MG capsule Take 20 mg by mouth every morning.    [provider]  potassium chloride  SA (KLOR-CON  M20) 20 MEQ tablet Take 1  tablet by mouth daily. 12/06/21   [provider]  predniSONE (DELTASONE) 10 MG tablet Taper 6-6-5-5-4-4-3-3-2-2-1-1-off 06/24/22   [provider]  rosuvastatin  (CRESTOR ) 10 MG tablet Take 10 mg by mouth daily.    [provider]  venlafaxine  XR (EFFEXOR -XR) 150 MG 24 hr capsule Take 150 mg by mouth daily with breakfast.    [provider]    Physical Exam: Vitals:   07/30/23 0729 07/30/23 1136  BP: (!) 144/63 (!) 160/64  Pulse: 95 83  Resp: 18 16  Temp: 98.3 F (36.8 C) 98.4 F (36.9 C)  TempSrc: Oral Oral  SpO2: 93% 93%  Weight: 58.5 kg   Height: 5' (1.524 m)     Constitutional: NAD, calm, comfortable Vitals:   07/30/23 0729 07/30/23 1136  BP: (!) 144/63 (!) 160/64  Pulse: 95 83  Resp: 18 16  Temp: 98.3 F (36.8 C) 98.4 F (36.9 C)  TempSrc: Oral Oral  SpO2: 93% 93%  Weight: 58.5 kg   Height: 5' (1.524 m)    Eyes: PERRL, lids and conjunctivae normal ENMT: Mucous membranes are moist. Posterior pharynx clear of any exudate or lesions.Normal dentition.  Neck: normal, supple, no masses, no thyromegaly Respiratory: clear to auscultation bilaterally, no wheezing, no crackles. Normal respiratory effort. No accessory muscle use.  Cardiovascular: Regular rate and rhythm, no murmurs / rubs / gallops. No extremity edema. 2+ pedal pulses. No carotid bruits.  Abdomen: RUQ tenderness, no rebound no guarding, no masses palpated. No hepatosplenomegaly. Bowel sounds positive.  Musculoskeletal: no clubbing / cyanosis. No joint deformity upper and lower extremities. Good ROM, no contractures. Normal muscle tone.  Skin: no rashes, lesions, ulcers. No induration Neurologic: CN 2-12 grossly intact. Sensation intact, DTR normal. Strength 5/5 in all 4.  Psychiatric: Normal judgment and insight. Alert and oriented x 3. Normal mood.     Labs on Admission: I have personally reviewed following labs and imaging studies  CBC: Recent Labs  Lab 07/30/23 0733   WBC 30.4*  HGB 16.6*  HCT 47.5*  MCV 89.5  PLT 280   Basic Metabolic Panel: Recent Labs  Lab 07/30/23 0733  NA 131*  K 3.1*  CL 94*  CO2 20*  GLUCOSE 162*  BUN 12  CREATININE 0.90  CALCIUM  9.6   GFR: Estimated Creatinine Clearance: 43.9 mL/min (by C-G formula based on SCr of 0.9 mg/dL). Liver Function Tests: Recent Labs  Lab 07/30/23 0733  AST 38  ALT 22  ALKPHOS 63  BILITOT 0.6  PROT 7.4  ALBUMIN 4.3   Recent Labs  Lab 07/30/23 0733  LIPASE 30   No results for input(s): "AMMONIA" in the last 168 hours. Coagulation Profile: No results for input(s): "INR", "PROTIME" in the last 168 hours. Cardiac Enzymes: No results for input(s): "CKTOTAL", "CKMB", "CKMBINDEX", "TROPONINI" in the last 168 hours. BNP (last 3 results) No results for input(s): "PROBNP" in the last 8760 hours. HbA1C: No results for input(s): "HGBA1C" in the last 72 hours. CBG: No results for input(s): "GLUCAP" in the last 168 hours. Lipid Profile: No results for input(s): "  CHOL", "HDL", "LDLCALC", "TRIG", "CHOLHDL", "LDLDIRECT" in the last 72 hours. Thyroid Function Tests: No results for input(s): "TSH", "T4TOTAL", "FREET4", "T3FREE", "THYROIDAB" in the last 72 hours. Anemia Panel: No results for input(s): "VITAMINB12", "FOLATE", "FERRITIN", "TIBC", "IRON", "RETICCTPCT" in the last 72 hours. Urine analysis:    Component Value Date/Time   COLORURINE YELLOW (A) 07/30/2023 1056   APPEARANCEUR CLEAR (A) 07/30/2023 1056   LABSPEC 1.034 (H) 07/30/2023 1056   PHURINE 7.0 07/30/2023 1056   GLUCOSEU NEGATIVE 07/30/2023 1056   HGBUR SMALL (A) 07/30/2023 1056   BILIRUBINUR NEGATIVE 07/30/2023 1056   KETONESUR NEGATIVE 07/30/2023 1056   PROTEINUR 100 (A) 07/30/2023 1056   NITRITE NEGATIVE 07/30/2023 1056   LEUKOCYTESUR NEGATIVE 07/30/2023 1056    Radiological Exams on Admission: US  Abdomen Limited RUQ (LIVER/GB) Result Date: 07/30/2023 CLINICAL DATA:  Right upper quadrant pain EXAM: ULTRASOUND  ABDOMEN LIMITED RIGHT UPPER QUADRANT COMPARISON:  CT earlier 07/30/2023. FINDINGS: Gallbladder: Dilated gallbladder. No shadowing stones. Wall is 3 mm in thickness, borderline. The sonographer does report pain when scanning the gallbladder. Common bile duct: Diameter: 5 mm Liver: Echogenic hepatic parenchyma consistent with fatty liver infiltration. Portal vein is patent on color Doppler imaging with normal direction of blood flow towards the liver. Other: None. IMPRESSION: Dilated gallbladder. No obvious stones. Borderline wall thickening. There is also a Murphy's sign reported. With this appearance and the prior CT scan would recommend further workup such HIDA scan to further delineate or MRCP. Fatty liver infiltration.  No ductal dilatation. Electronically Signed   By: Adrianna Horde M.D.   On: 07/30/2023 13:13   CT ABDOMEN PELVIS W CONTRAST Result Date: 07/30/2023 CLINICAL DATA:  Abdominal pain, acute EXAM: CT ABDOMEN AND PELVIS WITH CONTRAST TECHNIQUE: Multidetector CT imaging of the abdomen and pelvis was performed using the standard protocol following bolus administration of intravenous contrast. RADIATION DOSE REDUCTION: This exam was performed according to the departmental dose-optimization program which includes automated exposure control, adjustment of the mA and/or kV according to patient size and/or use of iterative reconstruction technique. CONTRAST:  100mL OMNIPAQUE  IOHEXOL  300 MG/ML  SOLN COMPARISON:  None Available. FINDINGS: Lower chest: No acute abnormality. Hepatobiliary: No focal liver lesion. The gallbladder is mildly distended. There is a small focus of fluid adjacent to the gallbladder and liver which is annotated. Pancreas: Unremarkable. No pancreatic ductal dilatation or surrounding inflammatory changes. Spleen: Normal in size without focal abnormality. Adrenals/Urinary Tract: Adrenal glands are unremarkable. Kidneys are normal, without renal calculi, focal lesion, or hydronephrosis.  Bladder is unremarkable. Stomach/Bowel: The stomach is nondilated. No dilated loops of small bowel are appreciated. The appendix is partially visualized and grossly within normal limits. There is a moderate volume of stool material in the right colon as well as within the transverse colon. Left colon is largely decompressed. Vascular/Lymphatic: Mild diffuse atherosclerotic changes are present in the abdominal aorta and major branch vessels. Reproductive: Status post hysterectomy. No adnexal masses. Other: A small volume of free fluid is present within the pelvis. A small amount of fluid is also present near the gallbladder tip adjacent to the liver. Musculoskeletal: Mild degenerative changes. Mild anterolisthesis of L5-S1. IMPRESSION: 1. Mild distension of the gallbladder with a small volume of fluid adjacent to the gallbladder tip and adjacent to the liver. Recommend further evaluation by right upper quadrant ultrasound for more sensitive assessment of acute cholecystitis. 2. A small volume of free fluid is present in the pelvis. 3. A moderate volume of stool material is present in the right  and transverse colon which may be symptomatic. Electronically Signed   By: Reagan Camera M.D.   On: 07/30/2023 09:52    EKG: Independently reviewed.  Sinus rhythm, no acute ST changes.  Assessment/Plan Principal Problem:   Cholecystitis Active Problems:   Acute cholecystitis  (please populate well all problems here in Problem List. (For example, if patient is on BP meds at home and you resume or decide to hold them, it is a problem that needs to be her. Same for CAD, COPD, HLD and so on)  Sepsis, without acute endorgan damage Acute cholecystitis - Sepsis evidenced by leukocytosis, elevated lactic acid level, source of infection is acute cholecystitis - Surgery consultation appreciated, given the history of taking Plavix , considered to be a suboptimal candidate for surgical invention.  Patient will have IR guided  cholecystostomy this afternoon.  Continue n.p.o., continue IV fluid - Hold off Plavix  today - Continue Zosyn  Hypokalemia - P.o. replacement - Check magnesium  level  History of aortic stenosis status post stenting - Resume Plavix  tomorrow, continue aspirin  and statin  HTN - Hold off home BP meds - Start as needed hydralazine   DVT prophylaxis: Lovenox Code Status: Full code Family Communication: Husband at bedside Disposition Plan: Patient is sick with sepsis secondary to acute cholecystitis requiring inpatient IR intervention as well as IV antibiotics, expect more than 2 midnight hospital stay Consults called: General surgeon, IR Admission status: Telemetry admission   [place] MD [facility] Pager [phone]  07/30/2023, 2:34 PM

## 2023-07-30 NOTE — ED Triage Notes (Signed)
 Pt presents to the ED POV from home for right sided abdominal upper and lower abdominal pain. Pt reports partial hysterectomy, but no other abdominal surgeries. Pt A&Ox4 at time of triage. VSS. Denies N/V/D

## 2023-07-30 NOTE — Consult Note (Signed)
 ED Pharmacy Antibiotic Sign Off An antibiotic consult was received from an ED provider for vancomycin per pharmacy dosing for surgical prophylaxis. A chart review was completed to assess appropriateness.   The following one time order(s) were placed:  Vancomycin 1000 mg IV x 1  Further antibiotic and/or antibiotic pharmacy consults should be ordered by the admitting provider if indicated.   Thank you for allowing pharmacy to be a part of this patient's care.   Ramonita Burow, [facility]  Clinical Pharmacist 07/30/23 12:14 PM

## 2023-07-31 DIAGNOSIS — K819 Cholecystitis, unspecified: Secondary | ICD-10-CM | POA: Diagnosis not present

## 2023-07-31 DIAGNOSIS — K810 Acute cholecystitis: Secondary | ICD-10-CM | POA: Diagnosis not present

## 2023-07-31 LAB — CBC
HCT: 37.6 % (ref 36.0–46.0)
Hemoglobin: 12.8 g/dL (ref 12.0–15.0)
MCH: 31.2 pg (ref 26.0–34.0)
MCHC: 34 g/dL (ref 30.0–36.0)
MCV: 91.7 fL (ref 80.0–100.0)
Platelets: 179 10*3/uL (ref 150–400)
RBC: 4.1 MIL/uL (ref 3.87–5.11)
RDW: 13 % (ref 11.5–15.5)
WBC: 18 10*3/uL — ABNORMAL HIGH (ref 4.0–10.5)
nRBC: 0 % (ref 0.0–0.2)

## 2023-07-31 LAB — COMPREHENSIVE METABOLIC PANEL WITH GFR
ALT: 106 U/L — ABNORMAL HIGH (ref 0–44)
AST: 52 U/L — ABNORMAL HIGH (ref 15–41)
Albumin: 3 g/dL — ABNORMAL LOW (ref 3.5–5.0)
Alkaline Phosphatase: 50 U/L (ref 38–126)
Anion gap: 9 (ref 5–15)
BUN: 11 mg/dL (ref 8–23)
CO2: 21 mmol/L — ABNORMAL LOW (ref 22–32)
Calcium: 8.4 mg/dL — ABNORMAL LOW (ref 8.9–10.3)
Chloride: 101 mmol/L (ref 98–111)
Creatinine, Ser: 0.66 mg/dL (ref 0.44–1.00)
GFR, Estimated: >60 mL/min (ref >60)
Glucose, Bld: 112 mg/dL — ABNORMAL HIGH (ref 70–99)
Potassium: 3.4 mmol/L — ABNORMAL LOW (ref 3.5–5.1)
Sodium: 131 mmol/L — ABNORMAL LOW (ref 135–145)
Total Bilirubin: 0.9 mg/dL (ref 0.0–1.2)
Total Protein: 5.4 g/dL — ABNORMAL LOW (ref 6.5–8.1)

## 2023-07-31 MED ORDER — POTASSIUM CHLORIDE CRYS ER 20 MEQ PO TBCR
40.0000 meq | EXTENDED_RELEASE_TABLET | Freq: Once | ORAL | Status: AC
  Administered 2023-07-31: 40 meq via ORAL
  Filled 2023-07-31: qty 2

## 2023-07-31 MED ORDER — TRAMADOL HCL 50 MG PO TABS
25.0000 mg | ORAL_TABLET | Freq: Four times a day (QID) | ORAL | Status: DC | PRN
  Administered 2023-07-31 – 2023-08-01 (×3): 25 mg via ORAL
  Filled 2023-07-31 (×3): qty 1

## 2023-07-31 NOTE — Plan of Care (Signed)
 Tolerated clear liquid dinner last night. Pt most of it without increased pain or nausea. 80ml op from biliary tube. Will empty one more time prior to shift change. Drainage appears to be green with some streaks of blood in it.   Problem: Education: Goal: Knowledge of General Education information will improve Description: Including pain rating scale, medication(s)/side effects and non-pharmacologic comfort measures Outcome: Progressing   Problem: Health Behavior/Discharge Planning: Goal: Ability to manage health-related needs will improve Outcome: Progressing   Problem: Clinical Measurements: Goal: Ability to maintain clinical measurements within normal limits will improve Outcome: Progressing Goal: Will remain free from infection Outcome: Progressing   Problem: Activity: Goal: Risk for activity intolerance will decrease Outcome: Progressing   Problem: Nutrition: Goal: Adequate nutrition will be maintained Outcome: Progressing   Problem: Coping: Goal: Level of anxiety will decrease Outcome: Progressing   Problem: Pain Managment: Goal: General experience of comfort will improve and/or be controlled Outcome: Progressing   Problem: Safety: Goal: Ability to remain free from injury will improve Outcome: Progressing

## 2023-07-31 NOTE — Progress Notes (Signed)
 PROGRESS NOTE    Kaitlin RELLER  WUJ:[id number] DOB: Mar 05, 1950 DOA: 07/30/2023 PCP: Yehuda Helms, MD    Assessment & Plan:   Principal Problem:   Cholecystitis Active Problems:   Acute cholecystitis  Assessment and Plan: Sepsis: met criteria w/ leukocytosis, tachypnea, elevated lactic acid, & secondary to cholecystitis. Continue on IV zosyn  & IVFs.  Acute cholecystitis: s/p cholecystostomy tube placed by IR 07/30/23. Continue on IV zosyn . Diet advanced to full liquid diet as per gen surg.  Gen surg following and recs apprec   Transaminitis: likely secondary to acute cholecystitis. Will continue to monitor    Hypokalemia: potassium given    Hx of aortic stenosis: continue on statin, aspirin  & restart plavix     HTN: holding home hydralazine , metoprolol , & hydrochlorothiazide  as BP is WNL.    Hyponatremia: stable. Will continue to monitor  Hypokalemia: potassium given       DVT prophylaxis: lovenox   Code Status: full  Family Communication:  Disposition Plan: likely d/c back home  Level of care: Telemetry Medical  Status is: Inpatient Remains inpatient appropriate because: severity of illness, requiring IV abxs    Consultants:  IR Gen surg   Procedures:   Antimicrobials: zosyn     Subjective: Pt c/o malaise   Objective: Vitals:   07/30/23 1954 07/31/23 0131 07/31/23 0400 07/31/23 0814  BP: (!) 110/47 (!) 127/58 (!) 141/61 134/64  Pulse: 80 77 81 78  Resp: 17 20 20 16   Temp: 98.2 F (36.8 C) 98.2 F (36.8 C) 98.1 F (36.7 C) 98.8 F (37.1 C)  TempSrc: Oral   Oral  SpO2: 96% 95% 97% 96%  Weight:      Height:        Intake/Output Summary (Last 24 hours) at 07/31/2023 0821 Last data filed at 07/31/2023 9562 Gross per 24 hour  Intake 1066.56 ml  Output 215 ml  Net 851.56 ml   Filed Weights   07/30/23 0729 07/30/23 1439  Weight: 58.5 kg 58.5 kg    Examination:  General exam: Appears calm and comfortable  Respiratory system: Clear  to auscultation. Respiratory effort normal. Cardiovascular system: S1 & S2 +. No rubs, gallops or clicks.  Gastrointestinal system: Abdomen is nondistended, soft and nontender. Normal bowel sounds heard. Central nervous system: Alert and oriented. Moves all extremities  Psychiatry: Judgement and insight appear normal. Mood & affect appropriate.     Data Reviewed: I have personally reviewed following labs and imaging studies  CBC: Recent Labs  Lab 07/30/23 0733 07/31/23 0442  WBC 30.4* 18.0*  HGB 16.6* 12.8  HCT 47.5* 37.6  MCV 89.5 91.7  PLT 280 179   Basic Metabolic Panel: Recent Labs  Lab 07/30/23 0733 07/31/23 0442  NA 131* 131*  K 3.1* 3.4*  CL 94* 101  CO2 20* 21*  GLUCOSE 162* 112*  BUN 12 11  CREATININE 0.90 0.66  CALCIUM  9.6 8.4*  MG 1.7  --    GFR: Estimated Creatinine Clearance: 49.4 mL/min (by C-G formula based on SCr of 0.66 mg/dL). Liver Function Tests: Recent Labs  Lab 07/30/23 0733 07/31/23 0442  AST 38 52*  ALT 22 106*  ALKPHOS 63 50  BILITOT 0.6 0.9  PROT 7.4 5.4*  ALBUMIN 4.3 3.0*   Recent Labs  Lab 07/30/23 0733  LIPASE 30   No results for input(s): "AMMONIA" in the last 168 hours. Coagulation Profile: No results for input(s): "INR", "PROTIME" in the last 168 hours. Cardiac Enzymes: No results for input(s): "CKTOTAL", "CKMB", "CKMBINDEX", "  TROPONINI" in the last 168 hours. BNP (last 3 results) No results for input(s): "PROBNP" in the last 8760 hours. HbA1C: No results for input(s): "HGBA1C" in the last 72 hours. CBG: No results for input(s): "GLUCAP" in the last 168 hours. Lipid Profile: No results for input(s): "CHOL", "HDL", "LDLCALC", "TRIG", "CHOLHDL", "LDLDIRECT" in the last 72 hours. Thyroid Function Tests: No results for input(s): "TSH", "T4TOTAL", "FREET4", "T3FREE", "THYROIDAB" in the last 72 hours. Anemia Panel: No results for input(s): "VITAMINB12", "FOLATE", "FERRITIN", "TIBC", "IRON", "RETICCTPCT" in the last 72  hours. Sepsis Labs: Recent Labs  Lab 07/30/23 0900 07/30/23 1052 07/30/23 1400 07/30/23 1723  LATICACIDVEN 3.5* 3.5* 2.0* 1.6    Recent Results (from the past 240 hours)  Aerobic/Anaerobic Culture w Gram Stain (surgical/deep wound)     Status: None (Preliminary result)   Collection Time: 07/30/23  3:48 PM   Specimen: BILE  Result Value Ref Range Status   Specimen Description   Final    BILE Performed at [facility], [address], [place], [place] [address]    Special Requests   Final    Normal Performed at [facility], [address] [place], [place] [address]    Gram Stain   Final    NO WBC SEEN RARE GRAM NEGATIVE RODS Performed at [facility], [address], [place], [place] [address]    Culture PENDING  Incomplete   Report Status PENDING  Incomplete         Radiology Studies: IR Perc Cholecystostomy Result Date: 07/30/2023 INDICATION: 73 year old female with acute acalculous cholecystitis. She is showing signs of systemic sepsis and is currently not a surgical candidate. Therefore, she presents for placement of a percutaneous cholecystostomy tube. EXAM: CHOLECYSTOSTOMY MEDICATIONS: In patient currently receiving intravenous antibiotics. ANESTHESIA/SEDATION: Moderate (conscious) sedation was employed during this procedure. A total of Versed  1 mg and Fentanyl  50 mcg was administered intravenously by the Radiology nurse. Moderate Sedation Time: 17 minutes. The patient's level of consciousness and vital signs were monitored continuously by radiology nursing throughout the procedure under my direct supervision. FLUOROSCOPY TIME:  Radiation exposure index: 6 mGy, reference air kerma COMPLICATIONS: None immediate. PROCEDURE: Informed written consent was obtained from the patient after a thorough discussion of the procedural risks, benefits and alternatives. All questions were addressed. Maximal Sterile Barrier Technique was utilized including caps,  mask, sterile gowns, sterile gloves, sterile drape, hand hygiene and skin antiseptic. A timeout was performed prior to the initiation of the procedure. The right upper quadrant was interrogated with ultrasound. The distended gallbladder is successfully visualized. Unfortunately, a good transhepatic course could not be easily identified. Therefore, a suitable skin entry site for a trans peritoneal course was identified. The skin was anesthetized with injection of 1% lidocaine  and a small dermatotomy was made. Under real-time ultrasound guidance, a 21 gauge Accustick needle was carefully advanced into the gallbladder lumen. There was return of dark brown bile. A 0.018 wire was coiled in the gallbladder lumen in the Accustick needle was exchanged for a transitional Accustick sheath. Once the sheath was within the gallbladder lumen, contrast was injected confirming its location within the gallbladder. A 0.035 Amplatz wire was then coiled in the gallbladder lumen. The transitional sheath was removed. The percutaneous tract was dilated to 10 [place]. A skater 10 [place] all-purpose drain was then advanced over the wire and formed in the gallbladder lumen. Contrast injection demonstrates good positioning of the percutaneous cholecystostomy tube. There was copious return of bile. A sample  was collected and sent for culture. IMPRESSION: 1. Successful placement of a percutaneous trans peritoneal cholecystostomy tube for treatment of acute acalculous cholecystitis. 2. A sample of aspirated bile was sent for Gram stain and culture. Electronically Signed   By: Fernando Hoyer M.D.   On: 07/30/2023 15:49   US  Abdomen Limited RUQ (LIVER/GB) Result Date: 07/30/2023 CLINICAL DATA:  Right upper quadrant pain EXAM: ULTRASOUND ABDOMEN LIMITED RIGHT UPPER QUADRANT COMPARISON:  CT earlier 07/30/2023. FINDINGS: Gallbladder: Dilated gallbladder. No shadowing stones. Wall is 3 mm in thickness, borderline. The sonographer does report pain  when scanning the gallbladder. Common bile duct: Diameter: 5 mm Liver: Echogenic hepatic parenchyma consistent with fatty liver infiltration. Portal vein is patent on color Doppler imaging with normal direction of blood flow towards the liver. Other: None. IMPRESSION: Dilated gallbladder. No obvious stones. Borderline wall thickening. There is also a Murphy's sign reported. With this appearance and the prior CT scan would recommend further workup such HIDA scan to further delineate or MRCP. Fatty liver infiltration.  No ductal dilatation. Electronically Signed   By: Adrianna Horde M.D.   On: 07/30/2023 13:13   CT ABDOMEN PELVIS W CONTRAST Result Date: 07/30/2023 CLINICAL DATA:  Abdominal pain, acute EXAM: CT ABDOMEN AND PELVIS WITH CONTRAST TECHNIQUE: Multidetector CT imaging of the abdomen and pelvis was performed using the standard protocol following bolus administration of intravenous contrast. RADIATION DOSE REDUCTION: This exam was performed according to the departmental dose-optimization program which includes automated exposure control, adjustment of the mA and/or kV according to patient size and/or use of iterative reconstruction technique. CONTRAST:  100mL OMNIPAQUE  IOHEXOL  300 MG/ML  SOLN COMPARISON:  None Available. FINDINGS: Lower chest: No acute abnormality. Hepatobiliary: No focal liver lesion. The gallbladder is mildly distended. There is a small focus of fluid adjacent to the gallbladder and liver which is annotated. Pancreas: Unremarkable. No pancreatic ductal dilatation or surrounding inflammatory changes. Spleen: Normal in size without focal abnormality. Adrenals/Urinary Tract: Adrenal glands are unremarkable. Kidneys are normal, without renal calculi, focal lesion, or hydronephrosis. Bladder is unremarkable. Stomach/Bowel: The stomach is nondilated. No dilated loops of small bowel are appreciated. The appendix is partially visualized and grossly within normal limits. There is a moderate volume  of stool material in the right colon as well as within the transverse colon. Left colon is largely decompressed. Vascular/Lymphatic: Mild diffuse atherosclerotic changes are present in the abdominal aorta and major branch vessels. Reproductive: Status post hysterectomy. No adnexal masses. Other: A small volume of free fluid is present within the pelvis. A small amount of fluid is also present near the gallbladder tip adjacent to the liver. Musculoskeletal: Mild degenerative changes. Mild anterolisthesis of L5-S1. IMPRESSION: 1. Mild distension of the gallbladder with a small volume of fluid adjacent to the gallbladder tip and adjacent to the liver. Recommend further evaluation by right upper quadrant ultrasound for more sensitive assessment of acute cholecystitis. 2. A small volume of free fluid is present in the pelvis. 3. A moderate volume of stool material is present in the right and transverse colon which may be symptomatic. Electronically Signed   By: Reagan Camera M.D.   On: 07/30/2023 09:52        Scheduled Meds:  aspirin  EC  81 mg Oral Q0600   clopidogrel   75 mg Oral Daily   enoxaparin  (LOVENOX ) injection  40 mg Subcutaneous Q24H   hydrALAZINE   75 mg Oral BID   hydrochlorothiazide   25 mg Oral Daily   pantoprazole   40 mg Oral  Daily   potassium chloride   40 mEq Oral Once   rosuvastatin   10 mg Oral Daily   sodium chloride  flush  5 mL Intracatheter Q8H   venlafaxine  XR  150 mg Oral Q breakfast   Continuous Infusions:  piperacillin -tazobactam (ZOSYN )  IV 3.375 g (07/31/23 0615)     LOS: 1 day       Alphonsus Jeans, MD [facility] Pager [phone]  If 7PM-7AM, please contact night-coverage [email or web address] 07/31/2023, 8:21 AM

## 2023-07-31 NOTE — Progress Notes (Signed)
 07/31/2023  Subjective: No acute events overnight.  Patient tolerating clear liquid diet.  Reports still some discomfort in the right upper quadrant and the drain insertion site but much improved compared to yesterday.  WBC improving significantly down to 18.  Lactic acidosis resolved.  Drain cultures currently showing gram-negative rods.  Vital signs: Temp:  [97.4 F (36.3 C)-98.8 F (37.1 C)] 98.8 F (37.1 C) (05/31 0814) Pulse Rate:  [76-97] 78 (05/31 0814) Resp:  [12-25] 16 (05/31 0814) BP: (110-160)/(47-101) 134/64 (05/31 0814) SpO2:  [90 %-97 %] 96 % (05/31 0814) Weight:  [58.5 kg] 58.5 kg (05/30 1439)   Intake/Output: 05/30 0701 - 05/31 0700 In: 1066.6 [I.V.:952.8; IV Piggyback:103.8] Out: 215 [Drains:215] Last BM Date : 07/29/23  Physical Exam: Constitutional: No acute distress Abdomen: Soft, nondistended, appropriate tender in the right upper quadrant much improved compared to yesterday.  Drain insertion site also a little bit tender.  Drain with bilious fluid.  Labs:  Recent Labs    07/30/23 0733 07/31/23 0442  WBC 30.4* 18.0*  HGB 16.6* 12.8  HCT 47.5* 37.6  PLT 280 179   Recent Labs    07/30/23 0733 07/31/23 0442  NA 131* 131*  K 3.1* 3.4*  CL 94* 101  CO2 20* 21*  GLUCOSE 162* 112*  BUN 12 11  CREATININE 0.90 0.66  CALCIUM  9.6 8.4*   No results for input(s): "LABPROT", "INR" in the last 72 hours.  Imaging: IR Perc Cholecystostomy Result Date: 07/30/2023 INDICATION: 73 year old female with acute acalculous cholecystitis. She is showing signs of systemic sepsis and is currently not a surgical candidate. Therefore, she presents for placement of a percutaneous cholecystostomy tube. EXAM: CHOLECYSTOSTOMY MEDICATIONS: In patient currently receiving intravenous antibiotics. ANESTHESIA/SEDATION: Moderate (conscious) sedation was employed during this procedure. A total of Versed  1 mg and Fentanyl  50 mcg was administered intravenously by the Radiology nurse.  Moderate Sedation Time: 17 minutes. The patient's level of consciousness and vital signs were monitored continuously by radiology nursing throughout the procedure under my direct supervision. FLUOROSCOPY TIME:  Radiation exposure index: 6 mGy, reference air kerma COMPLICATIONS: None immediate. PROCEDURE: Informed written consent was obtained from the patient after a thorough discussion of the procedural risks, benefits and alternatives. All questions were addressed. Maximal Sterile Barrier Technique was utilized including caps, mask, sterile gowns, sterile gloves, sterile drape, hand hygiene and skin antiseptic. A timeout was performed prior to the initiation of the procedure. The right upper quadrant was interrogated with ultrasound. The distended gallbladder is successfully visualized. Unfortunately, a good transhepatic course could not be easily identified. Therefore, a suitable skin entry site for a trans peritoneal course was identified. The skin was anesthetized with injection of 1% lidocaine  and a small dermatotomy was made. Under real-time ultrasound guidance, a 21 gauge Accustick needle was carefully advanced into the gallbladder lumen. There was return of dark brown bile. A 0.018 wire was coiled in the gallbladder lumen in the Accustick needle was exchanged for a transitional Accustick sheath. Once the sheath was within the gallbladder lumen, contrast was injected confirming its location within the gallbladder. A 0.035 Amplatz wire was then coiled in the gallbladder lumen. The transitional sheath was removed. The percutaneous tract was dilated to 10 [place]. A skater 10 [place] all-purpose drain was then advanced over the wire and formed in the gallbladder lumen. Contrast injection demonstrates good positioning of the percutaneous cholecystostomy tube. There was copious return of bile. A sample was collected and sent for culture. IMPRESSION: 1. Successful placement of a  percutaneous trans peritoneal  cholecystostomy tube for treatment of acute acalculous cholecystitis. 2. A sample of aspirated bile was sent for Gram stain and culture. Electronically Signed   By: Fernando Hoyer M.D.   On: 07/30/2023 15:49   US  Abdomen Limited RUQ (LIVER/GB) Result Date: 07/30/2023 CLINICAL DATA:  Right upper quadrant pain EXAM: ULTRASOUND ABDOMEN LIMITED RIGHT UPPER QUADRANT COMPARISON:  CT earlier 07/30/2023. FINDINGS: Gallbladder: Dilated gallbladder. No shadowing stones. Wall is 3 mm in thickness, borderline. The sonographer does report pain when scanning the gallbladder. Common bile duct: Diameter: 5 mm Liver: Echogenic hepatic parenchyma consistent with fatty liver infiltration. Portal vein is patent on color Doppler imaging with normal direction of blood flow towards the liver. Other: None. IMPRESSION: Dilated gallbladder. No obvious stones. Borderline wall thickening. There is also a Murphy's sign reported. With this appearance and the prior CT scan would recommend further workup such HIDA scan to further delineate or MRCP. Fatty liver infiltration.  No ductal dilatation. Electronically Signed   By: Adrianna Horde M.D.   On: 07/30/2023 13:13    Assessment/Plan: This is a 73 y.o. female with acute cholecystitis.  - Patient feels much better today after drain placement yesterday.  Discussed with her the plan for having the drain in place for at least 6 weeks and plan to do surgery as an outpatient for cholecystectomy.  We would need clearance to hold her Plavix  prior to surgery. - For now, during this admission, we will start advancing the patient's diet and will give her full liquid diet this morning and then likely a low-fat diet for dinner.  Continue IV antibiotics - She could potentially be discharged as early as tomorrow.   I spent 35 minutes dedicated to the care of this patient on the date of this encounter to include pre-visit review of records, face-to-face time with the patient discussing diagnosis  and management, and any post-visit coordination of care.  Marene Shape, MD [facility]

## 2023-07-31 NOTE — Progress Notes (Signed)
 Interventional Radiology Brief Note:  Review of chart after percutaneous cholecystostomy tube placement by Dr. Marne Sings yesterday shows adequate output from drain with clinical improvement (lactic acid resolved, WBC decreasing, afebrile overnight).  Will place follow-up orders for patient to be seen at [facility] in approximately 6 weeks.  Appointment can be canceled if she is able to undergo cholecystectomy in the interim.   Jerusalem Wert, MS RD PA-C
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
## 2023-08-01 DIAGNOSIS — K819 Cholecystitis, unspecified: Secondary | ICD-10-CM

## 2023-08-01 DIAGNOSIS — K810 Acute cholecystitis: Secondary | ICD-10-CM | POA: Diagnosis not present

## 2023-08-01 HISTORY — DX: Cholecystitis, unspecified: K81.9

## 2023-08-01 LAB — COMPREHENSIVE METABOLIC PANEL WITH GFR
ALT: 56 U/L — ABNORMAL HIGH (ref 0–44)
AST: 21 U/L (ref 15–41)
Albumin: 2.9 g/dL — ABNORMAL LOW (ref 3.5–5.0)
Alkaline Phosphatase: 50 U/L (ref 38–126)
Anion gap: 9 (ref 5–15)
BUN: 9 mg/dL (ref 8–23)
CO2: 24 mmol/L (ref 22–32)
Calcium: 8.7 mg/dL — ABNORMAL LOW (ref 8.9–10.3)
Chloride: 98 mmol/L (ref 98–111)
Creatinine, Ser: 0.62 mg/dL (ref 0.44–1.00)
GFR, Estimated: >60 mL/min (ref >60)
Glucose, Bld: 96 mg/dL (ref 70–99)
Potassium: 3 mmol/L — ABNORMAL LOW (ref 3.5–5.1)
Sodium: 131 mmol/L — ABNORMAL LOW (ref 135–145)
Total Bilirubin: 0.8 mg/dL (ref 0.0–1.2)
Total Protein: 5.7 g/dL — ABNORMAL LOW (ref 6.5–8.1)

## 2023-08-01 LAB — CBC
HCT: 35.6 % — ABNORMAL LOW (ref 36.0–46.0)
Hemoglobin: 12.3 g/dL (ref 12.0–15.0)
MCH: 31.3 pg (ref 26.0–34.0)
MCHC: 34.6 g/dL (ref 30.0–36.0)
MCV: 90.6 fL (ref 80.0–100.0)
Platelets: 164 10*3/uL (ref 150–400)
RBC: 3.93 MIL/uL (ref 3.87–5.11)
RDW: 12.5 % (ref 11.5–15.5)
WBC: 13.9 10*3/uL — ABNORMAL HIGH (ref 4.0–10.5)
nRBC: 0 % (ref 0.0–0.2)

## 2023-08-01 LAB — MAGNESIUM: Magnesium: 1.7 mg/dL (ref 1.7–2.4)

## 2023-08-01 MED ORDER — POTASSIUM CHLORIDE CRYS ER 20 MEQ PO TBCR
40.0000 meq | EXTENDED_RELEASE_TABLET | Freq: Two times a day (BID) | ORAL | Status: AC
  Administered 2023-08-01 (×2): 40 meq via ORAL
  Filled 2023-08-01 (×2): qty 2

## 2023-08-01 MED ORDER — SODIUM CHLORIDE FLUSH 0.9 % IV SOLN: 5.0000 mL | Freq: Every day | INTRAVENOUS | 1 refills | Status: DC

## 2023-08-01 MED ORDER — DOCUSATE SODIUM 100 MG PO CAPS
200.0000 mg | ORAL_CAPSULE | Freq: Two times a day (BID) | ORAL | Status: DC
  Administered 2023-08-01 (×2): 200 mg via ORAL
  Filled 2023-08-01 (×3): qty 2

## 2023-08-01 MED ORDER — TRAMADOL HCL 50 MG PO TABS: 50.0000 mg | ORAL_TABLET | Freq: Four times a day (QID) | ORAL | 0 refills | Status: DC | PRN

## 2023-08-01 MED ORDER — MAGNESIUM SULFATE 2 GM/50ML IV SOLN
2.0000 g | Freq: Once | INTRAVENOUS | Status: AC
  Administered 2023-08-01: 2 g via INTRAVENOUS
  Filled 2023-08-01: qty 50

## 2023-08-01 MED ORDER — AMOXICILLIN-POT CLAVULANATE 875-125 MG PO TABS: 1.0000 | ORAL_TABLET | Freq: Two times a day (BID) | ORAL | 0 refills | Status: DC

## 2023-08-01 NOTE — Plan of Care (Signed)

## 2023-08-01 NOTE — Evaluation (Signed)
 Physical Therapy Evaluation Patient Details Name: Kaitlin Miller MRN: [id number] DOB: 24-Sep-1950 Today's Date: 08/01/2023  History of Present Illness  Pt is a 73 y.o. 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 MD assessment includes acute cholecystitis s/p cholecystostomy tube placed by IR 07/30/23, sepsis, transaminitis, hyponatremia, hypokalemia, hypomagnesemia.   Clinical Impression  Pt was pleasant and motivated to participate during the session and put forth good effort throughout. Pt required no physical assist with transfers, gait, or stairs but all movements were slow and somewhat cautious in nature.  Pt was generally steady in standing but reported feeling weaker than at baseline which caused her to walk much more slowly than at baseline.  Pt reported no pain at rest and 5/10 pain at her drain insertion site with activity with drain clipped to gown for support; pt declined pain meds.  Pt will benefit from continued PT services upon discharge to safely address deficits listed in patient problem list for decreased caregiver assistance and eventual return to PLOF.          If plan is discharge home, recommend the following: A little help with walking and/or transfers;A little help with bathing/dressing/bathroom;Assistance with cooking/housework;Help with stairs or ramp for entrance;Assist for transportation   Can travel by private vehicle        Equipment Recommendations None recommended by PT  Recommendations for Other Services       Functional Status Assessment Patient has had a recent decline in their functional status and demonstrates the ability to make significant improvements in function in a reasonable and predictable amount of time.     Precautions / Restrictions Precautions Precautions: Fall Restrictions Weight  Bearing Restrictions Per Provider Order: No Other Position/Activity Restrictions: RUQ drain      Mobility  Bed Mobility               General bed mobility comments: NT, in recliner pre-post session    Transfers Overall transfer level: Needs assistance Equipment used: None Transfers: Sit to/from Stand Sit to Stand: Supervision           General transfer comment: Good eccentric and concentric control and stability    Ambulation/Gait Ambulation/Gait assistance: Supervision Gait Distance (Feet): 250 Feet Assistive device: None Gait Pattern/deviations: Step-through pattern, Decreased step length - right, Decreased step length - left Gait velocity: decreased     General Gait Details: Pt steady with amb without an AD but with very slow cadence that pt reported was secondary to general weakness and is not her baseline cadence  Stairs Stairs: Yes Stairs assistance: Supervision Stair Management: One rail Left, Alternating pattern, Forwards Number of Stairs: 4 General stair comments: Slow cadence with use of one rail but steady with good concentric and eccentric control  Wheelchair Mobility     Tilt Bed    Modified Rankin (Stroke Patients Only)       Balance Overall balance assessment: Needs assistance   Sitting balance-Leahy Scale: Normal     Standing balance support: No upper extremity supported, During functional activity Standing balance-Leahy Scale: Good                               Pertinent Vitals/Pain Pain Assessment Pain Assessment: No/denies pain    Home Living Family/patient expects to be discharged to:: Private residence Living Arrangements: Spouse/significant other  Available Help at Discharge: Family;Available 24 hours/day Type of Home: Mobile home Home Access: Stairs to enter Entrance Stairs-Rails: Right;Left;Can reach both Entrance Stairs-Number of Steps: 4   Home Layout: One level Home Equipment: None      Prior  Function Prior Level of Function : Independent/Modified Independent             Mobility Comments: Ind amb community distances without an AD, no fall history ADLs Comments: Ind with ADLs     Extremity/Trunk Assessment   Upper Extremity Assessment Upper Extremity Assessment: Overall WFL for tasks assessed    Lower Extremity Assessment Lower Extremity Assessment: Generalized weakness       Communication   Communication Communication: No apparent difficulties    Cognition Arousal: Alert Behavior During Therapy: WFL for tasks assessed/performed   PT - Cognitive impairments: No apparent impairments                         Following commands: Intact       Cueing Cueing Techniques: Verbal cues     General Comments      Exercises     Assessment/Plan    PT Assessment Patient needs continued PT services  PT Problem List Decreased strength;Decreased activity tolerance;Decreased balance       PT Treatment Interventions Gait training;Stair training;Functional mobility training;Therapeutic activities;Therapeutic exercise;Balance training;Patient/family education    PT Goals (Current goals can be found in the Care Plan section)  Acute Rehab PT Goals Patient Stated Goal: To get my strength back PT Goal Formulation: With patient Time For Goal Achievement: 08/14/23 Potential to Achieve Goals: Good    Frequency Min 1X/week     Co-evaluation               AM-PAC PT "6 Clicks" Mobility  Outcome Measure Help needed turning from your back to your side while in a flat bed without using bedrails?: A Little Help needed moving from lying on your back to sitting on the side of a flat bed without using bedrails?: A Little Help needed moving to and from a bed to a chair (including a wheelchair)?: A Little Help needed standing up from a chair using your arms (e.g., wheelchair or bedside chair)?: A Little Help needed to walk in hospital room?: A Little Help  needed climbing 3-5 steps with a railing? : A Little 6 Click Score: 18    End of Session Equipment Utilized During Treatment: Gait belt (gait belt placed high under arms to avoid drain) Activity Tolerance: Patient tolerated treatment well Patient left: in chair;with call bell/phone within reach;with family/visitor present Nurse Communication: Mobility status PT Visit Diagnosis: Difficulty in walking, not elsewhere classified (R26.2);Muscle weakness (generalized) (M62.81)    Time: 1610-9604 PT Time Calculation (min) (ACUTE ONLY): 19 min   Charges:   PT Evaluation $PT Eval Moderate Complexity: 1 Mod   PT General Charges $$ ACUTE PT VISIT: 1 Visit       D. Madalyn Scarce PT, DPT 08/01/23, 12:17 PM

## 2023-08-01 NOTE — Progress Notes (Signed)
 Mobility Specialist - Progress Note   08/01/23 0833  Mobility  Activity Ambulated with assistance in hallway;Stood at bedside;Dangled on edge of bed  Level of Assistance Contact guard assist, steadying assist  Assistive Device Other (Comment) (Iv Pole)  Distance Ambulated (ft) 200 ft  Activity Response Tolerated well  Mobility Referral Yes  Mobility visit 1 Mobility  Mobility Specialist Start Time (ACUTE ONLY) 0757  Mobility Specialist Stop Time (ACUTE ONLY) 0818  Mobility Specialist Time Calculation (min) (ACUTE ONLY) 21 min   Pt supine in bed on RA upon arrival. Pt completes bed mobility indep. Pt STS and ambulates in hallway CGA. Pt initially unstable and dizzy upon standing but goes away during ambulation. Pt left in recliner with needs in reach.   Wash Hack  Mobility Specialist  08/01/23 8:35 AM

## 2023-08-01 NOTE — Progress Notes (Signed)
 Nurse added a stopcock to the drain port.Patient Husband was taught how to flush the biliary drain with 5 ml , he was very comfortable in the teach back method. Will continue to assess .

## 2023-08-01 NOTE — Progress Notes (Signed)
 PROGRESS NOTE    Kaitlin Miller  ONG:[id number] DOB: [birth year] DOA: 07/30/2023 PCP: Yehuda Helms, MD    Assessment & Plan:   Principal Problem:   Cholecystitis Active Problems:   Acute cholecystitis  Assessment and Plan: Sepsis: met criteria w/ leukocytosis, tachypnea, elevated lactic acid, & secondary to cholecystitis. Continue on IV abxs. Resolved   Acute cholecystitis: s/p cholecystostomy tube placed by IR 07/30/23. Continue on IV zosyn  while inpatient and d/c home w/ augmentin x 10 days as per gen surg. Diet advanced to heart healthy as per gen surg. Gen surg following and recs apprec   Transaminitis: likely secondary to acute cholecystitis. AST is WNL. ALT is still elevated but trending down    Hx of aortic stenosis: continue on plavix , aspirin , statin    HTN: continue on hydrochlorothiazide , hydralazine     Hyponatremia: stable. Will continue to monitor   Hypokalemia: potassium ordered  Hypomagnesemia: mg sulfate ordered    DVT prophylaxis: lovenox   Code Status: full  Family Communication:  Disposition Plan: likely d/c back home  Level of care: Telemetry Medical  Status is: Inpatient Remains inpatient appropriate because: severity of illness, requiring IV abxs    Consultants:  IR Gen surg   Procedures:   Antimicrobials: zosyn     Subjective: Pt c/o pain w/ movement   Objective: Vitals:   07/31/23 0814 07/31/23 1538 07/31/23 2038 08/01/23 0426  BP: 134/64 (!) 114/42 (!) 133/45 (!) 115/50  Pulse: 78 82 88 82  Resp: 16 17 16 18   Temp: 98.8 F (37.1 C) 98.6 F (37 C) 98 F (36.7 C) 98.6 F (37 C)  TempSrc: Oral Oral    SpO2: 96% 97% 93% 92%  Weight:      Height:        Intake/Output Summary (Last 24 hours) at 08/01/2023 0807 Last data filed at 08/01/2023 0421 Gross per 24 hour  Intake 1407.59 ml  Output 175 ml  Net 1232.59 ml   Filed Weights   07/30/23 0729 07/30/23 1439  Weight: 58.5 kg 58.5 kg    Examination:  General exam:  appears calm but uncomfortable  Respiratory system: clear breath sounds b/l  Cardiovascular system: S1/S2+. No rubs or clicks  Gastrointestinal system: Abd is soft, NT, ND & hypoactive bowel sounds. Cholecystomy tube present  Central nervous system: alert & oriented. Moves all extremities  Psychiatry: Judgement and insight appears normal. Flat mood affect    Data Reviewed: I have personally reviewed following labs and imaging studies  CBC: Recent Labs  Lab 07/30/23 0733 07/31/23 0442  WBC 30.4* 18.0*  HGB 16.6* 12.8  HCT 47.5* 37.6  MCV 89.5 91.7  PLT 280 179   Basic Metabolic Panel: Recent Labs  Lab 07/30/23 0733 07/31/23 0442 08/01/23 0722  NA 131* 131* 131*  K 3.1* 3.4* 3.0*  CL 94* 101 98  CO2 20* 21* 24  GLUCOSE 162* 112* 96  BUN 12 11 9   CREATININE 0.90 0.66 0.62  CALCIUM  9.6 8.4* 8.7*  MG 1.7  --   --    GFR: Estimated Creatinine Clearance: 49.4 mL/min (by C-G formula based on SCr of 0.62 mg/dL). Liver Function Tests: Recent Labs  Lab 07/30/23 0733 07/31/23 0442 08/01/23 0722  AST 38 52* 21  ALT 22 106* 56*  ALKPHOS 63 50 50  BILITOT 0.6 0.9 0.8  PROT 7.4 5.4* 5.7*  ALBUMIN 4.3 3.0* 2.9*   Recent Labs  Lab 07/30/23 0733  LIPASE 30   No results for input(s): "AMMONIA" in  the last 168 hours. Coagulation Profile: No results for input(s): "INR", "PROTIME" in the last 168 hours. Cardiac Enzymes: No results for input(s): "CKTOTAL", "CKMB", "CKMBINDEX", "TROPONINI" in the last 168 hours. BNP (last 3 results) No results for input(s): "PROBNP" in the last 8760 hours. HbA1C: No results for input(s): "HGBA1C" in the last 72 hours. CBG: No results for input(s): "GLUCAP" in the last 168 hours. Lipid Profile: No results for input(s): "CHOL", "HDL", "LDLCALC", "TRIG", "CHOLHDL", "LDLDIRECT" in the last 72 hours. Thyroid Function Tests: No results for input(s): "TSH", "T4TOTAL", "FREET4", "T3FREE", "THYROIDAB" in the last 72 hours. Anemia Panel: No  results for input(s): "VITAMINB12", "FOLATE", "FERRITIN", "TIBC", "IRON", "RETICCTPCT" in the last 72 hours. Sepsis Labs: Recent Labs  Lab 07/30/23 0900 07/30/23 1052 07/30/23 1400 07/30/23 1723  LATICACIDVEN 3.5* 3.5* 2.0* 1.6    Recent Results (from the past 240 hours)  Culture, blood (Routine X 2) w Reflex to ID Panel     Status: None (Preliminary result)   Collection Time: 07/30/23  9:05 AM   Specimen: BLOOD  Result Value Ref Range Status   Specimen Description BLOOD RIGHT ANTECUBITAL  Final   Special Requests   Final    BOTTLES DRAWN AEROBIC AND ANAEROBIC Blood Culture results may not be optimal due to an inadequate volume of blood received in culture bottles   Culture   Final    NO GROWTH 2 DAYS Performed at [facility], [address] [place], [place] [address]    Report Status PENDING  Incomplete  Culture, blood (Routine X 2) w Reflex to ID Panel     Status: None (Preliminary result)   Collection Time: 07/30/23  9:20 AM   Specimen: BLOOD  Result Value Ref Range Status   Specimen Description BLOOD RIGHT ANTECUBITAL  Final   Special Requests   Final    BOTTLES DRAWN AEROBIC AND ANAEROBIC Blood Culture results may not be optimal due to an inadequate volume of blood received in culture bottles   Culture   Final    NO GROWTH 2 DAYS Performed at [facility], [address] [place], [place] [address]    Report Status PENDING  Incomplete  Aerobic/Anaerobic Culture w Gram Stain (surgical/deep wound)     Status: None (Preliminary result)   Collection Time: 07/30/23  3:48 PM   Specimen: BILE  Result Value Ref Range Status   Specimen Description   Final    BILE Performed at [facility], [address], [place], [place] [address]    Special Requests   Final    Normal Performed at [facility], [address] [place], [place] [address]    Gram Stain NO WBC SEEN RARE GRAM NEGATIVE RODS   Final   Culture   Final    TOO YOUNG TO  READ Performed at [facility], [address] [place], [place] [address]    Report Status PENDING  Incomplete         Radiology Studies: IR Perc Cholecystostomy Result Date: 07/30/2023 INDICATION: 73 year old female with acute acalculous cholecystitis. She is showing signs of systemic sepsis and is currently not a surgical candidate. Therefore, she presents for placement of a percutaneous cholecystostomy tube. EXAM: CHOLECYSTOSTOMY MEDICATIONS: In patient currently receiving intravenous antibiotics. ANESTHESIA/SEDATION: Moderate (conscious) sedation was employed during this procedure. A total of Versed  1 mg and Fentanyl  50 mcg was administered intravenously by the Radiology nurse. Moderate Sedation Time: 17 minutes. The patient's level of consciousness and vital signs  were monitored continuously by radiology nursing throughout the procedure under my direct supervision. FLUOROSCOPY TIME:  Radiation exposure index: 6 mGy, reference air kerma COMPLICATIONS: None immediate. PROCEDURE: Informed written consent was obtained from the patient after a thorough discussion of the procedural risks, benefits and alternatives. All questions were addressed. Maximal Sterile Barrier Technique was utilized including caps, mask, sterile gowns, sterile gloves, sterile drape, hand hygiene and skin antiseptic. A timeout was performed prior to the initiation of the procedure. The right upper quadrant was interrogated with ultrasound. The distended gallbladder is successfully visualized. Unfortunately, a good transhepatic course could not be easily identified. Therefore, a suitable skin entry site for a trans peritoneal course was identified. The skin was anesthetized with injection of 1% lidocaine  and a small dermatotomy was made. Under real-time ultrasound guidance, a 21 gauge Accustick needle was carefully advanced into the gallbladder lumen. There was return of dark brown bile. A 0.018 wire was coiled in the  gallbladder lumen in the Accustick needle was exchanged for a transitional Accustick sheath. Once the sheath was within the gallbladder lumen, contrast was injected confirming its location within the gallbladder. A 0.035 Amplatz wire was then coiled in the gallbladder lumen. The transitional sheath was removed. The percutaneous tract was dilated to 10 [place]. A skater 10 [place] all-purpose drain was then advanced over the wire and formed in the gallbladder lumen. Contrast injection demonstrates good positioning of the percutaneous cholecystostomy tube. There was copious return of bile. A sample was collected and sent for culture. IMPRESSION: 1. Successful placement of a percutaneous trans peritoneal cholecystostomy tube for treatment of acute acalculous cholecystitis. 2. A sample of aspirated bile was sent for Gram stain and culture. Electronically Signed   By: Fernando Hoyer M.D.   On: 07/30/2023 15:49   US  Abdomen Limited RUQ (LIVER/GB) Result Date: 07/30/2023 CLINICAL DATA:  Right upper quadrant pain EXAM: ULTRASOUND ABDOMEN LIMITED RIGHT UPPER QUADRANT COMPARISON:  CT earlier 07/30/2023. FINDINGS: Gallbladder: Dilated gallbladder. No shadowing stones. Wall is 3 mm in thickness, borderline. The sonographer does report pain when scanning the gallbladder. Common bile duct: Diameter: 5 mm Liver: Echogenic hepatic parenchyma consistent with fatty liver infiltration. Portal vein is patent on color Doppler imaging with normal direction of blood flow towards the liver. Other: None. IMPRESSION: Dilated gallbladder. No obvious stones. Borderline wall thickening. There is also a Murphy's sign reported. With this appearance and the prior CT scan would recommend further workup such HIDA scan to further delineate or MRCP. Fatty liver infiltration.  No ductal dilatation. Electronically Signed   By: Adrianna Horde M.D.   On: 07/30/2023 13:13   CT ABDOMEN PELVIS W CONTRAST Result Date: 07/30/2023 CLINICAL DATA:  Abdominal  pain, acute EXAM: CT ABDOMEN AND PELVIS WITH CONTRAST TECHNIQUE: Multidetector CT imaging of the abdomen and pelvis was performed using the standard protocol following bolus administration of intravenous contrast. RADIATION DOSE REDUCTION: This exam was performed according to the departmental dose-optimization program which includes automated exposure control, adjustment of the mA and/or kV according to patient size and/or use of iterative reconstruction technique. CONTRAST:  OMNIPAQUE  IOHEXOL  300 MG/ML  SOLN COMPARISON:  None Available. FINDINGS: Lower chest: No acute abnormality. Hepatobiliary: No focal liver lesion. The gallbladder is mildly distended. There is a small focus of fluid adjacent to the gallbladder and liver which is annotated. Pancreas: Unremarkable. No pancreatic ductal dilatation or surrounding inflammatory changes. Spleen: Normal in size without focal abnormality. Adrenals/Urinary Tract: Adrenal glands are unremarkable. Kidneys are normal, without renal  calculi, focal lesion, or hydronephrosis. Bladder is unremarkable. Stomach/Bowel: The stomach is nondilated. No dilated loops of small bowel are appreciated. The appendix is partially visualized and grossly within normal limits. There is a moderate volume of stool material in the right colon as well as within the transverse colon. Left colon is largely decompressed. Vascular/Lymphatic: Mild diffuse atherosclerotic changes are present in the abdominal aorta and major branch vessels. Reproductive: Status post hysterectomy. No adnexal masses. Other: A small volume of free fluid is present within the pelvis. A small amount of fluid is also present near the gallbladder tip adjacent to the liver. Musculoskeletal: Mild degenerative changes. Mild anterolisthesis of L5-S1. IMPRESSION: 1. Mild distension of the gallbladder with a small volume of fluid adjacent to the gallbladder tip and adjacent to the liver. Recommend further evaluation by right  upper quadrant ultrasound for more sensitive assessment of acute cholecystitis. 2. A small volume of free fluid is present in the pelvis. 3. A moderate volume of stool material is present in the right and transverse colon which may be symptomatic. Electronically Signed   By: Reagan Camera M.D.   On: 07/30/2023 09:52        Scheduled Meds:  aspirin  EC  81 mg Oral Q0600   clopidogrel   75 mg Oral Daily   enoxaparin  (LOVENOX ) injection  40 mg Subcutaneous Q24H   hydrALAZINE   75 mg Oral BID   hydrochlorothiazide   25 mg Oral Daily   pantoprazole   40 mg Oral Daily   potassium chloride   40 mEq Oral BID   rosuvastatin   10 mg Oral Daily   sodium chloride  flush  5 mL Intracatheter Q8H   venlafaxine  XR  150 mg Oral Q breakfast   Continuous Infusions:  piperacillin -tazobactam (ZOSYN )  IV 3.375 g (08/01/23 0548)     LOS: 2 days       Alphonsus Jeans, MD [facility] Pager [phone]  If 7PM-7AM, please contact night-coverage [email or web address] 08/01/2023, 8:07 AM

## 2023-08-01 NOTE — Progress Notes (Signed)
 08/01/2023  Subjective: No acute events overnight.  Patient reports that her pain continues to improve.  She has been able to tolerate full liquid diet.  Passing flatus but no bowel movement yet.  Drain culture still showing gram-negative rods.  Vital signs: Temp:  [97.7 F (36.5 C)-98.6 F (37 C)] 97.7 F (36.5 C) (06/01 0842) Pulse Rate:  [82-88] 85 (06/01 0842) Resp:  [16-18] 16 (06/01 0842) BP: (107-133)/(42-50) 107/47 (06/01 0842) SpO2:  [92 %-97 %] 92 % (06/01 0842)   Intake/Output: 05/31 0701 - 06/01 0700 In: 1407.6 [P.O.:1190; I.V.:10; IV Piggyback:192.6] Out: 175 [Drains:175] Last BM Date : 08/29/23  Physical Exam: Constitutional: No acute distress Abdomen: Soft, nondistended, with improving tenderness in the right upper quadrant and drain insertion site.  Cholecystostomy drain in place with bile in the bag.  Labs:  Recent Labs    07/31/23 0442 08/01/23 0722  WBC 18.0* 13.9*  HGB 12.8 12.3  HCT 37.6 35.6*  PLT 179 164   Recent Labs    07/31/23 0442 08/01/23 0722  NA 131* 131*  K 3.4* 3.0*  CL 101 98  CO2 21* 24  GLUCOSE 112* 96  BUN 11 9  CREATININE 0.66 0.62  CALCIUM  8.4* 8.7*   No results for input(s): "LABPROT", "INR" in the last 72 hours.  Imaging: No results found.  Assessment/Plan: This is a 73 y.o. female with acute cholecystitis, status post percutaneous cholecystostomy drain placement.    - The patient clinically continues to improve.  Her white blood cell count continues to go down and is 13.9 today.  She has been tolerating a full liquid diet and is having flatus. - Will advance the patient to a cardiac diet today. - Will place order so the patient can be instructed how to flush her drain.  She will be flushing this once daily with 5 mL normal saline flush. - Cultures currently pending.  Continue IV Zosyn  for the time being. - As the patient has been clinically improving, potentially she could be able to get discharged today versus  tomorrow depending on how her progress is this afternoon. - She will follow-up with us  in about 2 weeks.  I have set up prescriptions for p.o. Augmentin, tramadol for pain control, and saline flushes.   I spent 35 minutes dedicated to the care of this patient on the date of this encounter to include pre-visit review of records, face-to-face time with the patient discussing diagnosis and management, and any post-visit coordination of care.  Marene Shape, MD [facility]

## 2023-08-01 NOTE — Discharge Instructions (Signed)
 Discharge Instructions: Please empty and record bag/drain output twice daily.  Bring drain record to office visit. Please flush your drain with 5 ml NS syringe once daily.  This will help prevent the drain from getting clogged. Please change dressing around drain with 4x4 gauze once daily or as needed to keep the area clean/dry.  Secure gauze with tape. Continue your antibiotic as prescribed and do not skip doses.  [facility]  They will call you to schedule when they will come out to see you for nursing and Physical Therapy Home health care service [phone]  [phone] Open ? Closes 5?PM

## 2023-08-02 DIAGNOSIS — K819 Cholecystitis, unspecified: Secondary | ICD-10-CM | POA: Diagnosis not present

## 2023-08-02 LAB — BASIC METABOLIC PANEL WITH GFR
Anion gap: 9 (ref 5–15)
BUN: 10 mg/dL (ref 8–23)
CO2: 23 mmol/L (ref 22–32)
Calcium: 8.7 mg/dL — ABNORMAL LOW (ref 8.9–10.3)
Chloride: 96 mmol/L — ABNORMAL LOW (ref 98–111)
Creatinine, Ser: 0.6 mg/dL (ref 0.44–1.00)
GFR, Estimated: >60 mL/min (ref >60)
Glucose, Bld: 119 mg/dL — ABNORMAL HIGH (ref 70–99)
Potassium: 3.4 mmol/L — ABNORMAL LOW (ref 3.5–5.1)
Sodium: 128 mmol/L — ABNORMAL LOW (ref 135–145)

## 2023-08-02 LAB — CBC
HCT: 36.2 % (ref 36.0–46.0)
Hemoglobin: 12.8 g/dL (ref 12.0–15.0)
MCH: 31.4 pg (ref 26.0–34.0)
MCHC: 35.4 g/dL (ref 30.0–36.0)
MCV: 88.9 fL (ref 80.0–100.0)
Platelets: 170 10*3/uL (ref 150–400)
RBC: 4.07 MIL/uL (ref 3.87–5.11)
RDW: 12.7 % (ref 11.5–15.5)
WBC: 13.3 10*3/uL — ABNORMAL HIGH (ref 4.0–10.5)
nRBC: 0 % (ref 0.0–0.2)

## 2023-08-02 LAB — MAGNESIUM: Magnesium: 2.1 mg/dL (ref 1.7–2.4)

## 2023-08-02 MED ORDER — TRAMADOL HCL 50 MG PO TABS: 50.0000 mg | ORAL_TABLET | Freq: Four times a day (QID) | ORAL | 0 refills | Status: AC | PRN

## 2023-08-02 MED ORDER — POTASSIUM CHLORIDE CRYS ER 20 MEQ PO TBCR
40.0000 meq | EXTENDED_RELEASE_TABLET | Freq: Once | ORAL | Status: AC
  Administered 2023-08-02: 40 meq via ORAL
  Filled 2023-08-02: qty 2

## 2023-08-02 MED ORDER — AMOXICILLIN-POT CLAVULANATE 875-125 MG PO TABS: 1.0000 | ORAL_TABLET | Freq: Two times a day (BID) | ORAL | 0 refills | Status: AC

## 2023-08-02 NOTE — TOC Progression Note (Signed)
 Transition of Care [facility]) - Progression Note    Patient Details  Name: BRADLEY HANDYSIDE MRN: [id number] Date of Birth: 03-31-1950  Transition of Care [facility]) CM/SW Contact  Alexandra Ice, RN Phone Number: 08/02/2023, 12:08 PM  Clinical Narrative:      Received message from Bartholomew Light with [facility], they are able to accept for home health services. Will add agency information to AVS.   Expected Discharge Plan: Home/Self Care Barriers to Discharge: Continued Medical Work up  Expected Discharge Plan and Services       Living arrangements for the past 2 months: Single Family Home Expected Discharge Date: 08/02/23                                     Social Determinants of Health (SDOH) Interventions SDOH Screenings   Food Insecurity: No Food Insecurity (07/30/2023)  Housing: Low Risk  (07/30/2023)  Transportation Needs: No Transportation Needs (07/30/2023)  Utilities: Not At Risk (07/30/2023)  Financial Resource Strain: Low Risk  (04/13/2023)   Received from [facility]  Social Connections: Socially Integrated (07/30/2023)  Tobacco Use: Medium Risk (07/30/2023)    Readmission Risk Interventions     No data to display

## 2023-08-02 NOTE — TOC Transition Note (Signed)
 Transition of Care [facility]) - Discharge Note   Patient Details  Name: Kaitlin Miller MRN: [id number] Date of Birth: 11/13/1950  Transition of Care [facility]) CM/SW Contact:  Alexandra Ice, RN Phone Number: 08/02/2023, 12:23 PM   Clinical Narrative:    Patient to discharge today, home with home health services. [facility] accepted patient and information added to AVS. Family is able to transport home at discharge.    Final next level of care: Home w Home Health Services Barriers to Discharge: Barriers Resolved   Patient Goals and CMS Choice Patient states their goals for this hospitalization and ongoing recovery are:: get better CMS Medicare.gov Compare Post Acute Care list provided to:: Patient Choice offered to / list presented to : Patient      Discharge Placement                  Name of family member notified: Audrene Blessing Patient and family notified of of transfer: 08/02/23  Discharge Plan and Services Additional resources added to the After Visit Summary for                    DME Agency: NA       HH Arranged: PT, RN, OT HH Agency: [facility] Date [facility] Agency Contacted: 08/02/23 Time HH Agency Contacted: 1223 Representative spoke with at [employer] [facility] Agency: Bartholomew Light  Social Drivers of Health (SDOH) Interventions SDOH Screenings   Food Insecurity: No Food Insecurity (07/30/2023)  Housing: Low Risk  (07/30/2023)  Transportation Needs: No Transportation Needs (07/30/2023)  Utilities: Not At Risk (07/30/2023)  Financial Resource Strain: Low Risk  (04/13/2023)   Received from [facility]  Social Connections: Socially Integrated (07/30/2023)  Tobacco Use: Medium Risk (07/30/2023)     Readmission Risk Interventions     No data to display

## 2023-08-02 NOTE — Plan of Care (Signed)
 IV removed, discharge instructions reviewed.  Saline flush supplies given to patient and patient and husband feel confident in managing flush of biliary drain.

## 2023-08-02 NOTE — Care Management Important Message (Signed)
 Important Message  Patient Details  Name: Kaitlin Miller MRN: [id number] Date of Birth: 1950-12-30   Important Message Given:  Yes - Medicare IM     Lindley Hiney W, CMA 08/02/2023, 2:09 PM

## 2023-08-02 NOTE — Discharge Summary (Signed)
 Physician Discharge Summary  KIERNAN FARKAS VHQ:[id number] DOB: 12-07-1950 DOA: 07/30/2023  PCP: Yehuda Helms, MD  Admit date: 07/30/2023 Discharge date: 08/02/2023  Admitted From: home  Disposition:  home w/ home health   Recommendations for Outpatient Follow-up:  Follow up with PCP in 1-2 weeks F/u gen surg, Dr. Dana Duncan, on 08/18/23 at 1:30PM F/u at IR clinic in 2-4 weeks   Home Health: yes Equipment/Devices: cholecystostomy drain   Discharge Condition: stable  CODE STATUS: full  Diet recommendation: Heart Healthy   Brief/Interim Summary: HPI was taken from Dr. Jeane Miguel: Trudi Fus is a 73 y.o. 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   Symptoms started yesterday afternoon, patient started to have cramping-like right-sided abdominal pain associate with nausea diaphoresis but denied any fever chills or vomiting.  No diarrhea or constipation.  Overnight symptoms became more severe and decided to come to hospital.   ED Course: Afebrile, not tachycardia blood pressure 160/64 O2 saturation 94% on 2 L.  CT abdomen pelvis showed inflamed enlarged gallbladder, RUQ ultrasound showed positive signs for Murphy signs implying for acute cholecystitis blood work showed WBC 30.4 hemoglobin 16 sodium 131 potassium 3.1 BUN 12 creatinine 0.9 glucose 162.   Patient was given Zosyn  and IV fluid in ED.  Discharge Diagnoses:  Principal Problem:   Cholecystitis Active Problems:   Acute cholecystitis Sepsis: met criteria w/ leukocytosis, tachypnea, elevated lactic acid, & secondary to cholecystitis. Continue on IV abxs. Resolved   Acute cholecystitis: s/p cholecystostomy tube placed by IR 07/30/23. Continue on IV zosyn  while inpatient and d/c home w/ augmentin x 10 days as per gen surg. Tolerating po diet. Gen surg following and recs apprec    Transaminitis: likely secondary to acute cholecystitis. AST is WNL. ALT is still elevated but trending down    Hx of aortic stenosis: continue on plavix , aspirin , statin    HTN: continue on hydrochlorothiazide , hydralazine     Hyponatremia: labile. Will continue to monitor  Hypokalemia: potassium given   Hypomagnesemia: WNL today    Discharge Instructions  Discharge Instructions     Diet - low sodium heart healthy   Complete by: As directed    Discharge instructions   Complete by: As directed    F/u w/ PCP in 1-2 weeks. F/u w/ gen surg, Dr. Dana Duncan, in 2 weeks. F/u at IR clinic in 2-4 weeks.   Increase activity slowly   Complete by: As directed    No wound care   Complete by: As directed       Allergies as of 08/02/2023   No Known Allergies      Medication List     STOP taking these medications    metoprolol  tartrate 50 MG tablet Commonly known as: LOPRESSOR    predniSONE 10 MG tablet Commonly known as: DELTASONE       TAKE these medications    amoxicillin-clavulanate 875-125 MG tablet Commonly known as: AUGMENTIN Take 1 tablet by mouth 2 (two) times daily for 10 days.   aspirin  EC 81 MG tablet Take 1 tablet (81 mg total) by mouth daily at 6 (six) AM. Swallow whole.   clopidogrel  75 MG tablet Commonly known as: PLAVIX  TAKE 1 TABLET BY MOUTH EVERY DAY   hydrALAZINE  50 MG tablet Commonly known as: APRESOLINE  TAKE 1.5 TABLETS BY MOUTH 2 TIMES DAILY   hydrochlorothiazide  25 MG tablet Commonly known as:  HYDRODIURIL  Take 25 mg by mouth daily.   Klor-Con  M20 20 MEQ tablet Generic drug: potassium chloride  SA Take 1 tablet by mouth daily.   multivitamin tablet Take 1 tablet by mouth daily.   omeprazole 20 MG capsule Commonly known as: PRILOSEC Take 20 mg by mouth every morning.   rosuvastatin  10 MG tablet Commonly known as: CRESTOR  Take 10 mg by mouth daily.   sodium chloride  flush 0.9 % Soln injection 5 mLs by Intracatheter route daily.    traMADol 50 MG tablet Commonly known as: Ultram Take 1 tablet (50 mg total) by mouth every 6 (six) hours as needed for up to 5 days for moderate pain (pain score 4-6) or severe pain (pain score 7-10).   venlafaxine  XR 150 MG 24 hr capsule Commonly known as: EFFEXOR -XR Take 150 mg by mouth daily with breakfast.   Vitamin D  125 MCG (5000 UT) Caps Take 1 capsule by mouth every morning.        Follow-up Information     Emmalene Hare, MD. Go on 08/18/2023.   Specialty: General Surgery Why: Go to appointment on 06/18 at 130 PM Contact information: [address] [place] [address] [phone]         Yehuda Helms, MD Follow up.   Specialty: Internal Medicine Why: F/u w/ PCP in 1-2 weeks Contact information: [address] [facility] [place] [address] [phone]                No Known Allergies  Consultations: Gen surg IR    Procedures/Studies: IR Perc Cholecystostomy Result Date: 07/30/2023 INDICATION: 73 year old female with acute acalculous cholecystitis. She is showing signs of systemic sepsis and is currently not a surgical candidate. Therefore, she presents for placement of a percutaneous cholecystostomy tube. EXAM: CHOLECYSTOSTOMY MEDICATIONS: In patient currently receiving intravenous antibiotics. ANESTHESIA/SEDATION: Moderate (conscious) sedation was employed during this procedure. A total of Versed  1 mg and Fentanyl  50 mcg was administered intravenously by the Radiology nurse. Moderate Sedation Time: 17 minutes. The patient's level of consciousness and vital signs were monitored continuously by radiology nursing throughout the procedure under my direct supervision. FLUOROSCOPY TIME:  Radiation exposure index: 6 mGy, reference air kerma COMPLICATIONS: None immediate. PROCEDURE: Informed written consent was obtained from the patient after a thorough discussion of the procedural risks, benefits and alternatives. All  questions were addressed. Maximal Sterile Barrier Technique was utilized including caps, mask, sterile gowns, sterile gloves, sterile drape, hand hygiene and skin antiseptic. A timeout was performed prior to the initiation of the procedure. The right upper quadrant was interrogated with ultrasound. The distended gallbladder is successfully visualized. Unfortunately, a good transhepatic course could not be easily identified. Therefore, a suitable skin entry site for a trans peritoneal course was identified. The skin was anesthetized with injection of 1% lidocaine  and a small dermatotomy was made. Under real-time ultrasound guidance, a 21 gauge Accustick needle was carefully advanced into the gallbladder lumen. There was return of dark brown bile. A 0.018 wire was coiled in the gallbladder lumen in the Accustick needle was exchanged for a transitional Accustick sheath. Once the sheath was within the gallbladder lumen, contrast was injected confirming its location within the gallbladder. A 0.035 Amplatz wire was then coiled in the gallbladder lumen. The transitional sheath was removed. The percutaneous tract was dilated to 10 [place]. A skater 10 [place] all-purpose drain was then advanced over the wire and formed in the gallbladder lumen. Contrast injection demonstrates good positioning of  the percutaneous cholecystostomy tube. There was copious return of bile. A sample was collected and sent for culture. IMPRESSION: 1. Successful placement of a percutaneous trans peritoneal cholecystostomy tube for treatment of acute acalculous cholecystitis. 2. A sample of aspirated bile was sent for Gram stain and culture. Electronically Signed   By: Fernando Hoyer M.D.   On: 07/30/2023 15:49   US  Abdomen Limited RUQ (LIVER/GB) Result Date: 07/30/2023 CLINICAL DATA:  Right upper quadrant pain EXAM: ULTRASOUND ABDOMEN LIMITED RIGHT UPPER QUADRANT COMPARISON:  CT earlier 07/30/2023. FINDINGS: Gallbladder: Dilated gallbladder. No  shadowing stones. Wall is 3 mm in thickness, borderline. The sonographer does report pain when scanning the gallbladder. Common bile duct: Diameter: 5 mm Liver: Echogenic hepatic parenchyma consistent with fatty liver infiltration. Portal vein is patent on color Doppler imaging with normal direction of blood flow towards the liver. Other: None. IMPRESSION: Dilated gallbladder. No obvious stones. Borderline wall thickening. There is also a Murphy's sign reported. With this appearance and the prior CT scan would recommend further workup such HIDA scan to further delineate or MRCP. Fatty liver infiltration.  No ductal dilatation. Electronically Signed   By: Adrianna Horde M.D.   On: 07/30/2023 13:13   CT ABDOMEN PELVIS W CONTRAST Result Date: 07/30/2023 CLINICAL DATA:  Abdominal pain, acute EXAM: CT ABDOMEN AND PELVIS WITH CONTRAST TECHNIQUE: Multidetector CT imaging of the abdomen and pelvis was performed using the standard protocol following bolus administration of intravenous contrast. RADIATION DOSE REDUCTION: This exam was performed according to the departmental dose-optimization program which includes automated exposure control, adjustment of the mA and/or kV according to patient size and/or use of iterative reconstruction technique. CONTRAST:  OMNIPAQUE  IOHEXOL  300 MG/ML  SOLN COMPARISON:  None Available. FINDINGS: Lower chest: No acute abnormality. Hepatobiliary: No focal liver lesion. The gallbladder is mildly distended. There is a small focus of fluid adjacent to the gallbladder and liver which is annotated. Pancreas: Unremarkable. No pancreatic ductal dilatation or surrounding inflammatory changes. Spleen: Normal in size without focal abnormality. Adrenals/Urinary Tract: Adrenal glands are unremarkable. Kidneys are normal, without renal calculi, focal lesion, or hydronephrosis. Bladder is unremarkable. Stomach/Bowel: The stomach is nondilated. No dilated loops of small bowel are appreciated. The  appendix is partially visualized and grossly within normal limits. There is a moderate volume of stool material in the right colon as well as within the transverse colon. Left colon is largely decompressed. Vascular/Lymphatic: Mild diffuse atherosclerotic changes are present in the abdominal aorta and major branch vessels. Reproductive: Status post hysterectomy. No adnexal masses. Other: A small volume of free fluid is present within the pelvis. A small amount of fluid is also present near the gallbladder tip adjacent to the liver. Musculoskeletal: Mild degenerative changes. Mild anterolisthesis of L5-S1. IMPRESSION: 1. Mild distension of the gallbladder with a small volume of fluid adjacent to the gallbladder tip and adjacent to the liver. Recommend further evaluation by right upper quadrant ultrasound for more sensitive assessment of acute cholecystitis. 2. A small volume of free fluid is present in the pelvis. 3. A moderate volume of stool material is present in the right and transverse colon which may be symptomatic. Electronically Signed   By: Reagan Camera M.D.   On: 07/30/2023 09:52   (Echo, Carotid, EGD, Colonoscopy, ERCP)    Subjective: Pt c/o fatigue    Discharge Exam: Vitals:   08/02/23 0438 08/02/23 0741  BP: (!) 136/58 (!) 141/60  Pulse: 82 81  Resp: 18 16  Temp: 98.5 F (36.9 C)  98.4 F (36.9 C)  SpO2: 91% 92%   Vitals:   08/01/23 1639 08/01/23 2017 08/02/23 0438 08/02/23 0741  BP: (!) 110/47 (!) 131/59 (!) 136/58 (!) 141/60  Pulse: 94 91 82 81  Resp: 16 16 18 16   Temp: 98.5 F (36.9 C) 98.9 F (37.2 C) 98.5 F (36.9 C) 98.4 F (36.9 C)  TempSrc: Oral  Oral   SpO2: 94% 92% 91% 92%  Weight:      Height:        General: Pt is alert, awake, not in acute distress Cardiovascular:  S1/S2 +, no rubs, no gallops Respiratory: CTA bilaterally, no wheezing, no rhonchi Abdominal: Soft, NT, ND, bowel sounds +. Cholecystostomy drain present Extremities: no edema, no  cyanosis    The results of significant diagnostics from this hospitalization (including imaging, microbiology, ancillary and laboratory) are listed below for reference.     Microbiology: Recent Results (from the past 240 hours)  Culture, blood (Routine X 2) w Reflex to ID Panel     Status: None (Preliminary result)   Collection Time: 07/30/23  9:05 AM   Specimen: BLOOD  Result Value Ref Range Status   Specimen Description BLOOD RIGHT ANTECUBITAL  Final   Special Requests   Final    BOTTLES DRAWN AEROBIC AND ANAEROBIC Blood Culture results may not be optimal due to an inadequate volume of blood received in culture bottles   Culture   Final    NO GROWTH 2 DAYS Performed at [facility], [address] [place], [place] [address]    Report Status PENDING  Incomplete  Culture, blood (Routine X 2) w Reflex to ID Panel     Status: None (Preliminary result)   Collection Time: 07/30/23  9:20 AM   Specimen: BLOOD  Result Value Ref Range Status   Specimen Description BLOOD RIGHT ANTECUBITAL  Final   Special Requests   Final    BOTTLES DRAWN AEROBIC AND ANAEROBIC Blood Culture results may not be optimal due to an inadequate volume of blood received in culture bottles   Culture   Final    NO GROWTH 2 DAYS Performed at [facility], [address] [place], [place] [address]    Report Status PENDING  Incomplete  Aerobic/Anaerobic Culture w Gram Stain (surgical/deep wound)     Status: None (Preliminary result)   Collection Time: 07/30/23  3:48 PM   Specimen: BILE  Result Value Ref Range Status   Specimen Description   Final    BILE Performed at [facility], [address] [place], [place] [address]    Special Requests   Final    Normal Performed at [facility], [address] [place], [place] [address]    Gram Stain   Final    NO WBC SEEN RARE GRAM NEGATIVE RODS Performed at [facility], [address], [place], [place] [address]     Culture   Final    MODERATE GRAM NEGATIVE RODS IDENTIFICATION AND SUSCEPTIBILITIES TO FOLLOW NO ANAEROBES ISOLATED; CULTURE IN PROGRESS FOR 5 DAYS    Report Status PENDING  Incomplete     Labs: BNP (last 3 results) No results for input(s): "BNP" in the last 8760 hours. Basic Metabolic Panel: Recent Labs  Lab 07/30/23 0733 07/31/23 0442 08/01/23 0722 08/02/23 0217  NA 131* 131* 131* 128*  K 3.1* 3.4* 3.0* 3.4*  CL 94* 101 98 96*  CO2 20* 21* 24 23  GLUCOSE 162* 112* 96 119*  BUN 12 11 9 10   CREATININE 0.90 0.66 0.62 0.60  CALCIUM  9.6 8.4* 8.7* 8.7*  MG 1.7  --  1.7 2.1   Liver Function Tests: Recent Labs  Lab 07/30/23 0733 07/31/23 0442 08/01/23 0722  AST 38 52* 21  ALT 22 106* 56*  ALKPHOS 63 50 50  BILITOT 0.6 0.9 0.8  PROT 7.4 5.4* 5.7*  ALBUMIN 4.3 3.0* 2.9*   Recent Labs  Lab 07/30/23 0733  LIPASE 30   No results for input(s): "AMMONIA" in the last 168 hours. CBC: Recent Labs  Lab 07/30/23 0733 07/31/23 0442 08/01/23 0722 08/02/23 0217  WBC 30.4* 18.0* 13.9* 13.3*  HGB 16.6* 12.8 12.3 12.8  HCT 47.5* 37.6 35.6* 36.2  MCV 89.5 91.7 90.6 88.9  PLT 280 179 164 170   Cardiac Enzymes: No results for input(s): "CKTOTAL", "CKMB", "CKMBINDEX", "TROPONINI" in the last 168 hours. BNP: Invalid input(s): "POCBNP" CBG: No results for input(s): "GLUCAP" in the last 168 hours. D-Dimer No results for input(s): "DDIMER" in the last 72 hours. Hgb A1c No results for input(s): "HGBA1C" in the last 72 hours. Lipid Profile No results for input(s): "CHOL", "HDL", "LDLCALC", "TRIG", "CHOLHDL", "LDLDIRECT" in the last 72 hours. Thyroid function studies No results for input(s): "TSH", "T4TOTAL", "T3FREE", "THYROIDAB" in the last 72 hours.  Invalid input(s): "FREET3" Anemia work up No results for input(s): "VITAMINB12", "FOLATE", "FERRITIN", "TIBC", "IRON", "RETICCTPCT" in the last 72 hours. Urinalysis    Component Value Date/Time   COLORURINE YELLOW (A)  07/30/2023 1056   APPEARANCEUR CLEAR (A) 07/30/2023 1056   LABSPEC 1.034 (H) 07/30/2023 1056   PHURINE 7.0 07/30/2023 1056   GLUCOSEU NEGATIVE 07/30/2023 1056   HGBUR SMALL (A) 07/30/2023 1056   BILIRUBINUR NEGATIVE 07/30/2023 1056   KETONESUR NEGATIVE 07/30/2023 1056   PROTEINUR 100 (A) 07/30/2023 1056   NITRITE NEGATIVE 07/30/2023 1056   LEUKOCYTESUR NEGATIVE 07/30/2023 1056   Sepsis Labs Recent Labs  Lab 07/30/23 0733 07/31/23 0442 08/01/23 0722 08/02/23 0217  WBC 30.4* 18.0* 13.9* 13.3*   Microbiology Recent Results (from the past 240 hours)  Culture, blood (Routine X 2) w Reflex to ID Panel     Status: None (Preliminary result)   Collection Time: 07/30/23  9:05 AM   Specimen: BLOOD  Result Value Ref Range Status   Specimen Description BLOOD RIGHT ANTECUBITAL  Final   Special Requests   Final    BOTTLES DRAWN AEROBIC AND ANAEROBIC Blood Culture results may not be optimal due to an inadequate volume of blood received in culture bottles   Culture   Final    NO GROWTH 2 DAYS Performed at [facility], [address] [place], [place] [address]    Report Status PENDING  Incomplete  Culture, blood (Routine X 2) w Reflex to ID Panel     Status: None (Preliminary result)   Collection Time: 07/30/23  9:20 AM   Specimen: BLOOD  Result Value Ref Range Status   Specimen Description BLOOD RIGHT ANTECUBITAL  Final   Special Requests   Final    BOTTLES DRAWN AEROBIC AND ANAEROBIC Blood Culture results may not be optimal due to an inadequate volume of blood received in culture bottles   Culture   Final    NO GROWTH 2 DAYS Performed at [facility], [address] [place], [place] [address]    Report Status PENDING  Incomplete  Aerobic/Anaerobic Culture w Gram Stain (surgical/deep wound)     Status: None (Preliminary result)   Collection  Time: 07/30/23  3:48 PM   Specimen: BILE  Result Value Ref Range Status   Specimen Description   Final     BILE Performed at [facility], [address], [place], [place] [address]    Special Requests   Final    Normal Performed at [facility], [address] [place], [place] [address]    Gram Stain   Final    NO WBC SEEN RARE GRAM NEGATIVE RODS Performed at [facility], [address] [place], [place] [address]    Culture   Final    MODERATE GRAM NEGATIVE RODS IDENTIFICATION AND SUSCEPTIBILITIES TO FOLLOW NO ANAEROBES ISOLATED; CULTURE IN PROGRESS FOR 5 DAYS    Report Status PENDING  Incomplete     Time coordinating discharge: Over 30 minutes  SIGNED:   Alphonsus Jeans, MD  [facility] 08/02/2023, 11:46 AM Pager   If 7PM-7AM, please contact night-coverage [email or web address]

## 2023-08-04 LAB — CULTURE, BLOOD (ROUTINE X 2)
Culture: NO GROWTH
Culture: NO GROWTH

## 2023-08-04 LAB — AEROBIC/ANAEROBIC CULTURE W GRAM STAIN (SURGICAL/DEEP WOUND)
Gram Stain: NONE SEEN
Special Requests: NORMAL

## 2023-08-18 ENCOUNTER — Encounter: Payer: Self-pay | Admitting: Surgery

## 2023-08-18 ENCOUNTER — Telehealth: Payer: Self-pay

## 2023-08-18 ENCOUNTER — Ambulatory Visit: Admitting: Surgery

## 2023-08-18 VITALS — BP 129/71 | HR 80 | Ht 60.0 in | Wt 125.8 lb

## 2023-08-18 DIAGNOSIS — K810 Acute cholecystitis: Secondary | ICD-10-CM

## 2023-08-18 NOTE — Telephone Encounter (Signed)
Faxed medical clearance to Dr. Fulton Reek at [phone].

## 2023-08-18 NOTE — Progress Notes (Signed)
 08/18/2023  History of Present Illness: Kaitlin Miller is a 73 y.o. female presenting for follow up of acute cholecystitis.  She was admitted on 07/30/23 with acute cholecystitis.  She takes Plavix  for carotid artery stenosis s/p left stent placement by Dr. Vonna Guardian in 2023.  Given that, she had a percutaneous cholecystostomy drain placed by IR on 07/30/23.  She was discharged home on 08/02/23.  She has completed her antibiotic course.  She is flushing her drain as instructed.  Reports some discomfort at the drain site itself, but otherwise no RUQ pain.  Tolerating diet, having bowel function.  However, she's still feeling tired/fatigued from her hospital admission.    Past Medical History: Past Medical History:  Diagnosis Date   Acid reflux    Anxiety    Cancer (HCC) 1980's   uterine   Carotid stenosis    BILATERAL   COPD (chronic obstructive pulmonary disease) (HCC)    HLD (hyperlipidemia)    Hypertension    IDA (iron deficiency anemia)    Multiple thyroid nodules    Osteopenia    Panlobular emphysema (HCC)    Vitamin D  deficiency      Past Surgical History: Past Surgical History:  Procedure Laterality Date   ABDOMINAL HYSTERECTOMY     CAROTID PTA/STENT INTERVENTION Left 05/12/2021   Procedure: CAROTID PTA/STENT INTERVENTION;  Surgeon: Celso College, MD;  Location: [facility];  Service: Cardiovascular;  Laterality: Left;   COLONOSCOPY WITH PROPOFOL  N/A 12/11/2020   Procedure: COLONOSCOPY WITH PROPOFOL ;  Surgeon: Toledo, Alphonsus Jeans, MD;  Location: [facility] ENDOSCOPY;  Service: Gastroenterology;  Laterality: N/A;   ESOPHAGOGASTRODUODENOSCOPY (EGD) WITH PROPOFOL  N/A 09/17/2021   Procedure: ESOPHAGOGASTRODUODENOSCOPY (EGD) WITH PROPOFOL ;  Surgeon: Toledo, Alphonsus Jeans, MD;  Location: [facility] ENDOSCOPY;  Service: Gastroenterology;  Laterality: N/A;   IR PERC CHOLECYSTOSTOMY  07/30/2023   LAPAROSCOPIC OVARIAN  1991    Home Medications: Prior to Admission medications   Medication Sig Start  Date End Date Taking? Authorizing Provider  aspirin  EC 81 MG EC tablet Take 1 tablet (81 mg total) by mouth daily at 6 (six) AM. Swallow whole. 05/16/21  Yes Brown, Fallon E, NP  Cholecalciferol  (VITAMIN D ) 125 MCG (5000 UT) CAPS Take 1 capsule by mouth every morning.    Yes [provider]  clopidogrel  (PLAVIX ) 75 MG tablet TAKE 1 TABLET BY MOUTH EVERY DAY 04/15/23  Yes Dew, Donald Frost, MD  hydrALAZINE  (APRESOLINE ) 50 MG tablet TAKE 1.5 TABLETS BY MOUTH 2 TIMES DAILY 10/14/18  Yes [provider]  hydrochlorothiazide  (HYDRODIURIL ) 25 MG tablet Take 25 mg by mouth daily. 10/24/18  Yes [provider]  Multiple Vitamin (MULTIVITAMIN) tablet Take 1 tablet by mouth daily.   Yes [provider]  omeprazole (PRILOSEC) 20 MG capsule Take 20 mg by mouth every morning.   Yes [provider]  potassium chloride  SA (KLOR-CON  M20) 20 MEQ tablet Take 1 tablet by mouth daily. 12/06/21  Yes [provider]  rosuvastatin  (CRESTOR ) 10 MG tablet Take 10 mg by mouth daily.   Yes [provider]  sodium chloride  flush 0.9 % SOLN injection 5 mLs by Intracatheter route daily. 08/01/23 09/30/23 Yes Morgan Keinath, Volanda Gruber, MD  venlafaxine  XR (EFFEXOR -XR) 150 MG 24 hr capsule Take 150 mg by mouth daily with breakfast.   Yes [provider]    Allergies: No Known Allergies  Review of Systems: Review of Systems  Constitutional:  Negative for chills and fever.  HENT:  Negative for hearing loss.  Respiratory:  Negative for shortness of breath.   Cardiovascular:  Negative for chest pain.  Gastrointestinal:  Positive for abdominal pain (discomfort at drain site). Negative for constipation, nausea and vomiting.  Genitourinary:  Negative for dysuria.  Musculoskeletal:  Negative for myalgias.  Skin:  Negative for rash.  Neurological:  Negative for dizziness.  Psychiatric/Behavioral:  Negative for depression.     Physical Exam BP 129/71   Pulse 80   Ht 5' (1.524  m)   Wt 125 lb 12.8 oz (57.1 kg)   SpO2 98%   BMI 24.57 kg/m  CONSTITUTIONAL: No acute distress HEENT:  Normocephalic, atraumatic, extraocular motion intact. NECK:  Trachea is midline, no jugular venous distention. RESPIRATORY:  Lungs are clear, and breath sounds are equal bilaterally. Normal respiratory effort without pathologic use of accessory muscles. CARDIOVASCULAR: Heart is regular without murmurs, gallops, or rubs. GI: The abdomen is soft, non-distended, appropriately sore at the drain site. Insertion site with mild irritation but no infection or leakage.  Drain with bilious fluid.  MUSCULOSKELETAL:  Normal gait, no peripheral edema. NEUROLOGIC:  Motor and sensation is grossly normal.  Cranial nerves are grossly intact. PSYCH:  Alert and oriented to person, place and time. Affect is normal.  Labs/Imaging: CT abdomen/pelvis on 07/30/23: IMPRESSION: 1. Mild distension of the gallbladder with a small volume of fluid adjacent to the gallbladder tip and adjacent to the liver. Recommend further evaluation by right upper quadrant ultrasound for more sensitive assessment of acute cholecystitis. 2. A small volume of free fluid is present in the pelvis. 3. A moderate volume of stool material is present in the right and transverse colon which may be symptomatic.  Ultrasound RUQ on 07/30/23: IMPRESSION: Dilated gallbladder. No obvious stones. Borderline wall thickening. There is also a Murphy's sign reported. With this appearance and the prior CT scan would recommend further workup such HIDA scan to further delineate or MRCP.   Fatty liver infiltration.  No ductal dilatation.  Assessment and Plan: This is a 73 y.o. female with acute cholecystitis s/p percutaneous cholecystostomy drain placement.  --Discussed with the patient again the rationale behind placing a percutaneous cholecystostomy drain instead of proceeding with surgery on her admission due to her Plavix .  Discussed with her  the timeline for surgery to allow the inflammation to subside and any scarring to decrease/soften.  Discussed with her also future imaging with cholangiogram vs drain exchange prior to surgery.  Eventual goal would be to perform cholecystectomy after she's recovered from this episode, in order to prevent further episodes in the future.  She's in agreement. --Discussed with her the plan for an eventual robotic assisted cholecystectomy.  Reviewed the surgery at length with her including the planned incisions, risks of bleeding, infection, injury to surrounding structures, the use of ICG to better evaluate the biliary anatomy, that this would likely be an outpatient surgery, the possibility of percutaneous drain, post-operative activity restrictions, pain control, and she's willing to proceed. --Will tentatively schedule her for surgery on 09/28/23.  Will send for medical clearance and also vascular surgery clearance to hold her Plavix  for surgery.  Follow up in a month for H&P update.  I spent 40 minutes dedicated to the care of this patient on the date of this encounter to include pre-visit review of records, face-to-face time with the patient discussing diagnosis and management, and any post-visit coordination of care.   Marene Shape, MD [facility]

## 2023-08-18 NOTE — Patient Instructions (Addendum)
 Follow up here 1 week prior to surgery.   You have requested to have your gallbladder removed. This will be done at [facility] with Dr. Mauri Sous.  You will most likely be out of work 1-2 weeks for this surgery.  If you have FMLA or disability paperwork that needs filled out you may drop this off at our office or this can be faxed to [phone].  You will return after your post-op appointment with a lifting restriction for approximately 4 more weeks.  You will be able to eat anything you would like to following surgery. But, start by eating a bland diet and advance this as tolerated. The Gallbladder diet is below, please go as closely by this diet as possible prior to surgery to avoid any further attacks.  Please see the (blue)pre-care form that you have been given today. Our surgery scheduler will call you to verify surgery date and to go over information.   If you have any questions, please call our office.  Laparoscopic Cholecystectomy Laparoscopic cholecystectomy is surgery to remove the gallbladder. The gallbladder is located in the upper right part of the abdomen, behind the liver. It is a storage sac for bile, which is produced in the liver. Bile aids in the digestion and absorption of fats. Cholecystectomy is often done for inflammation of the gallbladder (cholecystitis). This condition is usually caused by a buildup of gallstones (cholelithiasis) in the gallbladder. Gallstones can block the flow of bile, and that can result in inflammation and pain. In severe cases, emergency surgery may be required. If emergency surgery is not required, you will have time to prepare for the procedure. Laparoscopic surgery is an alternative to open surgery. Laparoscopic surgery has a shorter recovery time. Your common bile duct may also need to be examined during the procedure. If stones are found in the common bile duct, they may be removed. LET [facility] CARE PROVIDER KNOW ABOUT: Any  allergies you have. All medicines you are taking, including vitamins, herbs, eye drops, creams, and over-the-counter medicines. Previous problems you or members of your family have had with the use of anesthetics. Any blood disorders you have. Previous surgeries you have had.  Any medical conditions you have. RISKS AND COMPLICATIONS Generally, this is a safe procedure. However, problems may occur, including: Infection. Bleeding. Allergic reactions to medicines. Damage to other structures or organs. A stone remaining in the common bile duct. A bile leak from the cyst duct that is clipped when your gallbladder is removed. The need to convert to open surgery, which requires a larger incision in the abdomen. This may be necessary if your surgeon thinks that it is not safe to continue with a laparoscopic procedure. BEFORE THE PROCEDURE Ask your health care provider about: Changing or stopping your regular medicines. This is especially important if you are taking diabetes medicines or blood thinners. Taking medicines such as aspirin  and ibuprofen. These medicines can thin your blood. Do not take these medicines before your procedure if your health care provider instructs you not to. Follow instructions from your health care provider about eating or drinking restrictions. Let your health care provider know if you develop a cold or an infection before surgery. Plan to have someone take you home after the procedure. Ask your health care provider how your surgical site will be marked or identified. You may be given antibiotic medicine to help prevent infection. PROCEDURE To reduce your risk of infection: Your health care team will wash or sanitize their  hands. Your skin will be washed with soap. An IV tube may be inserted into one of your veins. You will be given a medicine to make you fall asleep (general anesthetic). A breathing tube will be placed in your mouth. The surgeon will make several  small cuts (incisions) in your abdomen. A thin, lighted tube (laparoscope) that has a tiny camera on the end will be inserted through one of the small incisions. The camera on the laparoscope will send a picture to a TV screen (monitor) in the operating room. This will give the surgeon a good view inside your abdomen. A gas will be pumped into your abdomen. This will expand your abdomen to give the surgeon more room to perform the surgery. Other tools that are needed for the procedure will be inserted through the other incisions. The gallbladder will be removed through one of the incisions. After your gallbladder has been removed, the incisions will be closed with stitches (sutures), staples, or skin glue. Your incisions may be covered with a bandage (dressing). The procedure may vary among health care providers and hospitals. AFTER THE PROCEDURE Your blood pressure, heart rate, breathing rate, and blood oxygen level will be monitored often until the medicines you were given have worn off. You will be given medicines as needed to control your pain.   This information is not intended to replace advice given to you by your health care provider. Make sure you discuss any questions you have with your health care provider.   Document Released: 02/16/2005 Document Revised: 11/07/2014 Document Reviewed: 09/28/2012 Elsevier Interactive Patient Education 2016 Elsevier Inc.   Low-Fat Diet for Gallbladder Conditions A low-fat diet can be helpful if you have pancreatitis or a gallbladder condition. With these conditions, your pancreas and gallbladder have trouble digesting fats. A healthy eating plan with less fat will help rest your pancreas and gallbladder and reduce your symptoms. WHAT DO I NEED TO KNOW ABOUT THIS DIET? Eat a low-fat diet. Reduce your fat intake to less than 20-30% of your total daily calories. This is less than 50-60 g of fat per day. Remember that you need some fat in your diet. Ask  your dietician what your daily goal should be. Choose nonfat and low-fat healthy foods. Look for the words nonfat, low fat, or fat free. As a guide, look on the label and choose foods with less than 3 g of fat per serving. Eat only one serving. Avoid alcohol. Do not smoke. If you need help quitting, talk with your health care provider. Eat small frequent meals instead of three large heavy meals. WHAT FOODS CAN I EAT? Grains Include healthy grains and starches such as potatoes, wheat bread, fiber-rich cereal, and brown rice. Choose whole grain options whenever possible. In adults, whole grains should account for 45-65% of your daily calories.  Fruits and Vegetables Eat plenty of fruits and vegetables. Fresh fruits and vegetables add fiber to your diet. Meats and Other Protein Sources Eat lean meat such as chicken and pork. Trim any fat off of meat before cooking it. Eggs, fish, and beans are other sources of protein. In adults, these foods should account for 10-35% of your daily calories. Dairy Choose low-fat milk and dairy options. Dairy includes fat and protein, as well as calcium .  Fats and Oils Limit high-fat foods such as fried foods, sweets, baked goods, sugary drinks.  Other Creamy sauces and condiments, such as mayonnaise, can add extra fat. Think about whether or not you need to  use them, or use smaller amounts or low fat options. WHAT FOODS ARE NOT RECOMMENDED? High fat foods, such as: [employer]. Ice cream. [place] toast. Sweet rolls. Pizza. Cheese bread. Foods covered with batter, butter, creamy sauces, or cheese. Fried foods. Sugary drinks and desserts. Foods that cause gas or bloating   This information is not intended to replace advice given to you by your health care provider. Make sure you discuss any questions you have with your health care provider.   Document Released: 02/21/2013 Document Reviewed: 02/21/2013 Elsevier Interactive Patient Education [employer].

## 2023-08-19 ENCOUNTER — Telehealth: Payer: Self-pay

## 2023-08-19 NOTE — Telephone Encounter (Signed)
 Received medical clearance from Dr. Claudius Cumins. Pts risk assessment is medium and is optimized for surgery.

## 2023-08-19 NOTE — Telephone Encounter (Signed)
 Faxed vascular clearance to Dr. Mikki Alexander at [phone].

## 2023-08-20 ENCOUNTER — Telehealth: Payer: Self-pay | Admitting: Surgery

## 2023-08-20 NOTE — Telephone Encounter (Signed)
 Patient has been advised of Pre-Admission date/time, and Surgery date at [facility].  Surgery Date: 09/28/23 Preadmission Testing Date: 09/21/23 (phone 8a-1p)  Patient informed of the scheduling process and surgery information given at time of office visit.  Patient has been made aware to call [phone], between 1-3:00pm the day before surgery, to find out what time to arrive for surgery.

## 2023-08-23 ENCOUNTER — Other Ambulatory Visit: Payer: Self-pay

## 2023-08-23 MED ORDER — SODIUM CHLORIDE FLUSH 0.9 % IV SOLN: 5.0000 mL | Freq: Every day | INTRAVENOUS | 1 refills | Status: DC

## 2023-08-23 MED ORDER — SODIUM CHLORIDE FLUSH 0.9 % IV SOLN
5.0000 mL | Freq: Every day | INTRAVENOUS | 1 refills | Status: AC
  Filled 2023-08-23: qty 150, 30d supply, fill #0
  Filled 2023-09-08: qty 150, 30d supply, fill #1

## 2023-08-31 ENCOUNTER — Telehealth: Payer: Self-pay

## 2023-08-31 NOTE — Telephone Encounter (Signed)
 Received Vascular clearance from Dr. Marea. Pt's risk assessment is low and is optimized for surgery. Per Dr. Marea: pt is to hold plavix  for 5 days prior to surgery.

## 2023-09-01 ENCOUNTER — Telehealth: Payer: Self-pay | Admitting: Surgery

## 2023-09-01 NOTE — Telephone Encounter (Signed)
 Pt has a drain and is to have gallblader surgery on 09-28-2023 but has noticed red blood in the drain that goes to the bag. Pt is concerned and asked us  to advise if this is normal. Pt number is [phone]

## 2023-09-01 NOTE — Telephone Encounter (Signed)
 Called and spoke with the patient and let her know that per Dr Desiderio that this may be caused by her Plavix . The gallbladder drain may be irritating the tissues. She is to let us  know if the bleeding increases or does not stop.

## 2023-09-08 ENCOUNTER — Other Ambulatory Visit: Payer: Self-pay

## 2023-09-21 ENCOUNTER — Encounter
Admission: RE | Admit: 2023-09-21 | Discharge: 2023-09-21 | Disposition: A | Discharge status: Home / Self Care | Source: Ambulatory Visit | Attending: Surgery | Admitting: Surgery

## 2023-09-21 ENCOUNTER — Other Ambulatory Visit: Payer: Self-pay

## 2023-09-21 HISTORY — DX: Disorder of arteries and arterioles, unspecified: I77.9

## 2023-09-21 HISTORY — DX: Malignant neoplasm of uterus, part unspecified: C55

## 2023-09-21 HISTORY — DX: Atherosclerotic heart disease of native coronary artery without angina pectoris: I25.10

## 2023-09-21 HISTORY — DX: Deficiency of other specified B group vitamins: E53.8

## 2023-09-21 HISTORY — DX: Polyp of colon: K63.5

## 2023-09-21 HISTORY — DX: Essential (primary) hypertension: I10

## 2023-09-21 HISTORY — DX: Occlusion and stenosis of left carotid artery: I65.22

## 2023-09-21 HISTORY — DX: Carcinoma in situ of cervix, unspecified: D06.9

## 2023-09-21 NOTE — Patient Instructions (Addendum)
 Your procedure is scheduled on: Tuesday, July 29 Report to the Registration Desk on the 1st floor of the [facility] To find out your arrival time, please call [phone] between 1PM - 3PM on: Monday, July 28 If your arrival time is 6:00 am, do not arrive before that time as the [facility] entrance doors do not open until 6:00 am.  REMEMBER: Instructions that are not followed completely may result in serious medical risk, up to and including death; or upon the discretion of your surgeon and anesthesiologist your surgery may need to be rescheduled.  Do not eat food after midnight the night before surgery.  No gum chewing or hard candies.  You may however, drink CLEAR liquids up to 2 hours before you are scheduled to arrive for your surgery. Do not drink anything within 2 hours of your scheduled arrival time.  Clear liquids include: - water  - apple juice without pulp - gatorade (not RED colors) - black coffee or tea (Do NOT add milk or creamers to the coffee or tea) Do NOT drink anything that is not on this list.  One week prior to surgery: starting today, July 22 Stop Anti-inflammatories (NSAIDS) such as Advil, Aleve, Ibuprofen, Motrin, Naproxen, Naprosyn and Aspirin  based products such as Excedrin, Goody's Powder, BC Powder. Stop ANY OVER THE COUNTER supplements until after surgery. Stop vitamin D , ferrous sulfate.  You may however, continue to take Tylenol  if needed for pain up until the day of surgery.  Clopidogrel  (Plavix ) - hold for 5 days before surgery. Last day to take is Wednesday, July 23. Resume the day AFTER surgery.  (PER DR. DEW'S CLEARANCE FORM)  Continue taking all of your other prescription medications up until the day of surgery including the 81 mg aspirin .  ON THE DAY OF SURGERY ONLY TAKE THESE MEDICATIONS WITH SIPS OF WATER:  Hydralazine  Omeprazole Potassium Venlafaxine   No Alcohol for 24 hours before or after surgery.  No Smoking including  e-cigarettes for 24 hours before surgery.  No chewable tobacco products for at least 6 hours before surgery.  No nicotine patches on the day of surgery.  Do not use any recreational drugs for at least a week (preferably 2 weeks) before your surgery.  Please be advised that the combination of cocaine and anesthesia may have negative outcomes, up to and including death. If you test positive for cocaine, your surgery will be cancelled.  On the morning of surgery brush your teeth with toothpaste and water, you may rinse your mouth with mouthwash if you wish. Do not swallow any toothpaste or mouthwash.  Since you are unable to get showers due to having the cholecystostomy drain in place, you will be given CHG wipes the day of surgery to wipe your skin with to help prevent infection. The nurse will provide instruction at that time.  Do not wear jewelry, make-up, hairpins, clips or nail polish.  For welded (permanent) jewelry: bracelets, anklets, waist bands, etc.  Please have this removed prior to surgery.  If it is not removed, there is a chance that hospital personnel will need to cut it off on the day of surgery.  Do not wear lotions, powders, or perfumes.   Do not shave body hair from the neck down 48 hours before surgery.  Contact lenses, hearing aids and dentures may not be worn into surgery.  Do not bring valuables to the hospital. [facility] is not responsible for any missing/lost belongings or valuables.   Notify your  doctor if there is any change in your medical condition (cold, fever, infection).  Wear comfortable clothing (specific to your surgery type) to the hospital.  After surgery, you can help prevent lung complications by doing breathing exercises.  Take deep breaths and cough every 1-2 hours. Your doctor may order a device called an Incentive Spirometer to help you take deep breaths. When coughing or sneezing, hold a pillow firmly against your incision with both hands.  This is called "splinting." Doing this helps protect your incision. It also decreases belly discomfort.  If you are being discharged the day of surgery, you will not be allowed to drive home. You will need a responsible individual to drive you home and stay with you for 24 hours after surgery.   If you are taking public transportation, you will need to have a responsible individual with you.  Please call the Pre-admissions Testing Dept. at [phone] if you have any questions about these instructions.  Surgery Visitation Policy:  Patients having surgery or a procedure may have two visitors.  Children under the age of 29 must have an adult with them who is not the patient.  Merchandiser, retail to address health-related social needs:  [email or web address]

## 2023-09-22 ENCOUNTER — Encounter: Payer: Self-pay | Admitting: Surgery

## 2023-09-22 ENCOUNTER — Ambulatory Visit: Admitting: Surgery

## 2023-09-22 VITALS — BP 133/68 | HR 82 | Temp 98.0°F | Ht 60.0 in | Wt 127.0 lb

## 2023-09-22 DIAGNOSIS — K810 Acute cholecystitis: Secondary | ICD-10-CM | POA: Diagnosis not present

## 2023-09-22 NOTE — H&P (View-Only) (Signed)
 09/22/2023  History of Present Illness: Kaitlin Miller is a 73 y.o. female presenting for follow up of acute cholecystitis.  The patient was admitted on 07/30/23 with acute cholecystitis but takes Plavix  for a left carotid stent, so a percutaneous cholecystostomy drain was placed.  She's currently scheduled for robotic cholecystectomy on 09/28/23 and presents today for H&P update.  In the interim, the patient has received clearance from his PCP and also from vascular surgery.  Her Plavix  can be stopped 5 days prior to surgery.  Patient denies any abdominal pain.  The drain has been working well, and she denies any troubles with it except that it was leaking between connectors.  She continues flushing her drain.  Past Medical History: Past Medical History:  Diagnosis Date   Acid reflux    Anxiety    B12 deficiency    Benign essential hypertension    Bilateral carotid artery disease (HCC)    CAD (coronary artery disease)    Carcinoma in situ of cervix    Cholecystitis 08/2023   Colon polyps    COPD (chronic obstructive pulmonary disease) (HCC)    HLD (hyperlipidemia)    IDA (iron deficiency anemia)    Left carotid stenosis    Multiple thyroid nodules    Osteopenia    Panlobular emphysema (HCC)    Uterine cancer (HCC) 1980's   Vitamin D  deficiency      Past Surgical History: Past Surgical History:  Procedure Laterality Date   ABDOMINAL HYSTERECTOMY     CAROTID PTA/STENT INTERVENTION Left 05/12/2021   Procedure: CAROTID PTA/STENT INTERVENTION;  Surgeon: Marea Selinda RAMAN, MD;  Location: [facility];  Service: Cardiovascular;  Laterality: Left;   COLONOSCOPY WITH PROPOFOL  N/A 12/11/2020   Procedure: COLONOSCOPY WITH PROPOFOL ;  Surgeon: Toledo, Ladell POUR, MD;  Location: [facility];  Service: Gastroenterology;  Laterality: N/A;   ESOPHAGOGASTRODUODENOSCOPY (EGD) WITH PROPOFOL  N/A 09/17/2021   Procedure: ESOPHAGOGASTRODUODENOSCOPY (EGD) WITH PROPOFOL ;  Surgeon: Toledo, Ladell POUR, MD;  Location: [facility]OS[facility];  Service: Gastroenterology;  Laterality: N/A;   IR PERC CHOLECYSTOSTOMY  07/30/2023   LAPAROSCOPIC OVARIAN  1991    Home Medications: Prior to Admission medications   Medication Sig Start Date End Date Taking? Authorizing Provider  aspirin  EC 81 MG EC tablet Take 1 tablet (81 mg total) by mouth daily at 6 (six) AM. Swallow whole. 05/16/21  Yes Brown, Fallon E, NP  Cholecalciferol  (VITAMIN D ) 125 MCG (5000 UT) CAPS Take 1 capsule by mouth every morning.    Yes [provider]  clopidogrel  (PLAVIX ) 75 MG tablet TAKE 1 TABLET BY MOUTH EVERY DAY 04/15/23  Yes Dew, Jason S, MD  cyanocobalamin (VITAMIN B12) 1000 MCG/ML injection Inject 1,000 mcg into the muscle once a week.   Yes [provider]  ferrous sulfate 325 (65 FE) MG EC tablet Take 325 mg by mouth in the morning.   Yes [provider]  hydrALAZINE  (APRESOLINE ) 50 MG tablet TAKE 1.5 TABLETS BY MOUTH 2 TIMES DAILY 10/14/18  Yes [provider]  hydrochlorothiazide  (HYDRODIURIL ) 25 MG tablet Take 25 mg by mouth in the morning. 10/24/18  Yes [provider]  omeprazole (PRILOSEC) 20 MG capsule Take 20 mg by mouth every morning.   Yes [provider]  potassium chloride  SA (KLOR-CON  M20) 20 MEQ tablet Take 20 mEq by mouth in the morning. 12/06/21  Yes [provider]  rosuvastatin  (CRESTOR ) 10 MG tablet Take 10 mg by mouth every evening.   Yes [provider]  sodium chloride  flush 0.9 % SOLN injection 5 mLs by Intracatheter route daily. 08/23/23 10/23/23 Yes Hattie Aguinaldo, Aloysius, MD  venlafaxine  XR (EFFEXOR -XR) 150 MG 24 hr capsule Take 150 mg by mouth daily with breakfast.   Yes [provider]    Allergies: No Known Allergies  Review of Systems: Review of Systems  Constitutional:  Negative for chills and fever.  Respiratory:  Negative for shortness of breath.   Cardiovascular:  Negative for chest pain.  Gastrointestinal:  Negative for  abdominal pain, nausea and vomiting.    Physical Exam BP 133/68   Pulse 82   Temp 98 F (36.7 C) (Oral)   Ht 5' (1.524 m)   Wt 127 lb (57.6 kg)   SpO2 96%   BMI 24.80 kg/m  CONSTITUTIONAL: No acute distress HEENT:  Normocephalic, atraumatic, extraocular motion intact. RESPIRATORY:  Lungs are clear, and breath sounds are equal bilaterally. Normal respiratory effort without pathologic use of accessory muscles. CARDIOVASCULAR: Heart is regular without murmurs, gallops, or rubs. GI: The abdomen is soft, non-distended, non-tender.  RUQ percutaneous cholecystostomy drain place.  Luer lock from the stopcock was loose, so was tightened at bedside. Bilious drainage in bag. NEUROLOGIC:  Motor and sensation is grossly normal.  Cranial nerves are grossly intact. PSYCH:  Alert and oriented to person, place and time. Affect is normal.  Labs/Imaging: Labs from 09/09/23: Na 136, K 4.7, Cl 103, CO2 30.7, BUN 15, Cr 0.8.  Total bili 0.2, AST 13, ALT 16, Alk Phos 75.  Albumin 3.4.  WBC 12.1, Hgb 9.4, Hct 29.1, Plt 381  Assessment and Plan: This is a 74 y.o. female with acute cholecystitis, s/p percutaneous cholecystostomy drain.  --The patient is scheduled for robotic cholecystectomy on 09/28/23.  She has been cleared by Dr. Auston and Dr. Marea.  Her last dose of Plavix  would be today, stopping doses tomorrow until after surgery.  Discussed with her again the surgical plan for a robotic assisted cholecystectomy, and reviewed the surgery at length with her including the planned incisions, the risks of bleeding, infection, injury to surrounding structures, the use of ICG to better evaluate the biliary anatomy, that this would be an outpatient surgery, possibility of another drain, pain control, and she's willing proceed. --All of her questions have been answered.  I spent 20 minutes dedicated to the care of this patient on the date of this encounter to include pre-visit review of records, face-to-face time  with the patient discussing diagnosis and management, and any post-visit coordination of care.   Kaitlin Sheree Plant, MD [facility]

## 2023-09-22 NOTE — Patient Instructions (Addendum)
 Last dose of Plavix  is 09/22/2023 until after surgery.    You have requested to have your gallbladder removed. This will be done at [facility] with Dr. Desiderio.  If you are on any injectable weight loss medication, you will need to stop taking your GLP-1 injectable (weight loss) medications 8 days before your surgery to avoid any complications with anesthesia.   You will most likely be out of work 1-2 weeks for this surgery.  If you have FMLA or disability paperwork that needs filled out you may drop this off at our office or this can be faxed to [phone].  You will return after your post-op appointment with a lifting restriction for approximately 4 more weeks.  You will be able to eat anything you would like to following surgery. But, start by eating a bland diet and advance this as tolerated. The Gallbladder diet is below, please go as closely by this diet as possible prior to surgery to avoid any further attacks.  Please see the (blue)pre-care form that you have been given today. Our surgery scheduler will call you to verify surgery date and to go over information.   If you have any questions, please call our office.  Laparoscopic Cholecystectomy Laparoscopic cholecystectomy is surgery to remove the gallbladder. The gallbladder is located in the upper right part of the abdomen, behind the liver. It is a storage sac for bile, which is produced in the liver. Bile aids in the digestion and absorption of fats. Cholecystectomy is often done for inflammation of the gallbladder (cholecystitis). This condition is usually caused by a buildup of gallstones (cholelithiasis) in the gallbladder. Gallstones can block the flow of bile, and that can result in inflammation and pain. In severe cases, emergency surgery may be required. If emergency surgery is not required, you will have time to prepare for the procedure. Laparoscopic surgery is an alternative to open surgery. Laparoscopic surgery has  a shorter recovery time. Your common bile duct may also need to be examined during the procedure. If stones are found in the common bile duct, they may be removed. LET [facility] CARE PROVIDER KNOW ABOUT: Any allergies you have. All medicines you are taking, including vitamins, herbs, eye drops, creams, and over-the-counter medicines. Previous problems you or members of your family have had with the use of anesthetics. Any blood disorders you have. Previous surgeries you have had.  Any medical conditions you have. RISKS AND COMPLICATIONS Generally, this is a safe procedure. However, problems may occur, including: Infection. Bleeding. Allergic reactions to medicines. Damage to other structures or organs. A stone remaining in the common bile duct. A bile leak from the cyst duct that is clipped when your gallbladder is removed. The need to convert to open surgery, which requires a larger incision in the abdomen. This may be necessary if your surgeon thinks that it is not safe to continue with a laparoscopic procedure. BEFORE THE PROCEDURE Ask your health care provider about: Changing or stopping your regular medicines. This is especially important if you are taking diabetes medicines or blood thinners. Taking medicines such as aspirin  and ibuprofen. These medicines can thin your blood. Do not take these medicines before your procedure if your health care provider instructs you not to. Follow instructions from your health care provider about eating or drinking restrictions. Let your health care provider know if you develop a cold or an infection before surgery. Plan to have someone take you home after the procedure. Ask your health care  provider how your surgical site will be marked or identified. You may be given antibiotic medicine to help prevent infection. PROCEDURE To reduce your risk of infection: Your health care team will wash or sanitize their hands. Your skin will be washed with  soap. An IV tube may be inserted into one of your veins. You will be given a medicine to make you fall asleep (general anesthetic). A breathing tube will be placed in your mouth. The surgeon will make several small cuts (incisions) in your abdomen. A thin, lighted tube (laparoscope) that has a tiny camera on the end will be inserted through one of the small incisions. The camera on the laparoscope will send a picture to a TV screen (monitor) in the operating room. This will give the surgeon a good view inside your abdomen. A gas will be pumped into your abdomen. This will expand your abdomen to give the surgeon more room to perform the surgery. Other tools that are needed for the procedure will be inserted through the other incisions. The gallbladder will be removed through one of the incisions. After your gallbladder has been removed, the incisions will be closed with stitches (sutures), staples, or skin glue. Your incisions may be covered with a bandage (dressing). The procedure may vary among health care providers and hospitals. AFTER THE PROCEDURE Your blood pressure, heart rate, breathing rate, and blood oxygen level will be monitored often until the medicines you were given have worn off. You will be given medicines as needed to control your pain.   This information is not intended to replace advice given to you by your health care provider. Make sure you discuss any questions you have with your health care provider.   Document Released: 02/16/2005 Document Revised: 11/07/2014 Document Reviewed: 09/28/2012 Elsevier Interactive Patient Education 2016 Elsevier Inc.   Low-Fat Diet for Gallbladder Conditions A low-fat diet can be helpful if you have pancreatitis or a gallbladder condition. With these conditions, your pancreas and gallbladder have trouble digesting fats. A healthy eating plan with less fat will help rest your pancreas and gallbladder and reduce your symptoms. WHAT DO I NEED  TO KNOW ABOUT THIS DIET? Eat a low-fat diet. Reduce your fat intake to less than 20-30% of your total daily calories. This is less than 50-60 g of fat per day. Remember that you need some fat in your diet. Ask your dietician what your daily goal should be. Choose nonfat and low-fat healthy foods. Look for the words nonfat, low fat, or fat free. As a guide, look on the label and choose foods with less than 3 g of fat per serving. Eat only one serving. Avoid alcohol. Do not smoke. If you need help quitting, talk with your health care provider. Eat small frequent meals instead of three large heavy meals. WHAT FOODS CAN I EAT? Grains Include healthy grains and starches such as potatoes, wheat bread, fiber-rich cereal, and brown rice. Choose whole grain options whenever possible. In adults, whole grains should account for 45-65% of your daily calories.  Fruits and Vegetables Eat plenty of fruits and vegetables. Fresh fruits and vegetables add fiber to your diet. Meats and Other Protein Sources Eat lean meat such as chicken and pork. Trim any fat off of meat before cooking it. Eggs, fish, and beans are other sources of protein. In adults, these foods should account for 10-35% of your daily calories. Dairy Choose low-fat milk and dairy options. Dairy includes fat and protein, as well as calcium .  Fats and Oils Limit high-fat foods such as fried foods, sweets, baked goods, sugary drinks.  Other Creamy sauces and condiments, such as mayonnaise, can add extra fat. Think about whether or not you need to use them, or use smaller amounts or low fat options. WHAT FOODS ARE NOT RECOMMENDED? High fat foods, such as: [employer]. Ice cream. [place] toast. Sweet rolls. Pizza. Cheese bread. Foods covered with batter, butter, creamy sauces, or cheese. Fried foods. Sugary drinks and desserts. Foods that cause gas or bloating   This information is not intended to replace advice given to you by your  health care provider. Make sure you discuss any questions you have with your health care provider.   Document Released: 02/21/2013 Document Reviewed: 02/21/2013 Elsevier Interactive Patient Education [employer]

## 2023-09-22 NOTE — Progress Notes (Signed)
 09/22/2023  History of Present Illness: Kaitlin Miller is a 73 y.o. female presenting for follow up of acute cholecystitis.  The patient was admitted on 07/30/23 with acute cholecystitis but takes Plavix  for a left carotid stent, so a percutaneous cholecystostomy drain was placed.  She's currently scheduled for robotic cholecystectomy on 09/28/23 and presents today for H&P update.  In the interim, the patient has received clearance from his PCP and also from vascular surgery.  Her Plavix  can be stopped 5 days prior to surgery.  Patient denies any abdominal pain.  The drain has been working well, and she denies any troubles with it except that it was leaking between connectors.  She continues flushing her drain.  Past Medical History: Past Medical History:  Diagnosis Date   Acid reflux    Anxiety    B12 deficiency    Benign essential hypertension    Bilateral carotid artery disease (HCC)    CAD (coronary artery disease)    Carcinoma in situ of cervix    Cholecystitis 08/2023   Colon polyps    COPD (chronic obstructive pulmonary disease) (HCC)    HLD (hyperlipidemia)    IDA (iron deficiency anemia)    Left carotid stenosis    Multiple thyroid nodules    Osteopenia    Panlobular emphysema (HCC)    Uterine cancer (HCC) 1980's   Vitamin D  deficiency      Past Surgical History: Past Surgical History:  Procedure Laterality Date   ABDOMINAL HYSTERECTOMY     CAROTID PTA/STENT INTERVENTION Left 05/12/2021   Procedure: CAROTID PTA/STENT INTERVENTION;  Surgeon: Marea Selinda RAMAN, MD;  Location: [facility];  Service: Cardiovascular;  Laterality: Left;   COLONOSCOPY WITH PROPOFOL  N/A 12/11/2020   Procedure: COLONOSCOPY WITH PROPOFOL ;  Surgeon: Toledo, Ladell POUR, MD;  Location: [facility] ENDOS[facility];  Service: Gastroenterology;  Laterality: N/A;   ESOPHAGOGASTRODUODENOSCOPY (EGD) WITH PROPOFOL  N/A 09/17/2021   Procedure: ESOPHAGOGASTRODUODENOSCOPY (EGD) WITH PROPOFOL ;  Surgeon: Toledo, Ladell POUR, MD;  Location: [facility] ENDOS[facility];  Service: Gastroenterology;  Laterality: N/A;   IR PERC CHOLECYSTOSTOMY  07/30/2023   LAPAROSCOPIC OVARIAN  1991    Home Medications: Prior to Admission medications   Medication Sig Start Date End Date Taking? Authorizing Provider  aspirin  EC 81 MG EC tablet Take 1 tablet (81 mg total) by mouth daily at 6 (six) AM. Swallow whole. 05/16/21  Yes Brown, Fallon E, NP  Cholecalciferol  (VITAMIN D ) 125 MCG (5000 UT) CAPS Take 1 capsule by mouth every morning.    Yes [provider]  clopidogrel  (PLAVIX ) 75 MG tablet TAKE 1 TABLET BY MOUTH EVERY DAY 04/15/23  Yes Dew, Jason S, MD  cyanocobalamin (VITAMIN B12) 1000 MCG/ML injection Inject 1,000 mcg into the muscle once a week.   Yes [provider]  ferrous sulfate 325 (65 FE) MG EC tablet Take 325 mg by mouth in the morning.   Yes [provider]  hydrALAZINE  (APRESOLINE ) 50 MG tablet TAKE 1.5 TABLETS BY MOUTH 2 TIMES DAILY 10/14/18  Yes [provider]  hydrochlorothiazide  (HYDRODIURIL ) 25 MG tablet Take 25 mg by mouth in the morning. 10/24/18  Yes [provider]  omeprazole (PRILOSEC) 20 MG capsule Take 20 mg by mouth every morning.   Yes [provider]  potassium chloride  SA (KLOR-CON  M20) 20 MEQ tablet Take 20 mEq by mouth in the morning. 12/06/21  Yes [provider]  rosuvastatin  (CRESTOR ) 10 MG tablet Take 10 mg by mouth every evening.   Yes [provider]  sodium chloride  flush 0.9 % SOLN injection 5 mLs by Intracatheter route daily. 08/23/23 10/23/23 Yes Hattie Aguinaldo, Aloysius, MD  venlafaxine  XR (EFFEXOR -XR) 150 MG 24 hr capsule Take 150 mg by mouth daily with breakfast.   Yes [provider]    Allergies: No Known Allergies  Review of Systems: Review of Systems  Constitutional:  Negative for chills and fever.  Respiratory:  Negative for shortness of breath.   Cardiovascular:  Negative for chest pain.  Gastrointestinal:  Negative for  abdominal pain, nausea and vomiting.    Physical Exam BP 133/68   Pulse 82   Temp 98 F (36.7 C) (Oral)   Ht 5' (1.524 m)   Wt 127 lb (57.6 kg)   SpO2 96%   BMI 24.80 kg/m  CONSTITUTIONAL: No acute distress HEENT:  Normocephalic, atraumatic, extraocular motion intact. RESPIRATORY:  Lungs are clear, and breath sounds are equal bilaterally. Normal respiratory effort without pathologic use of accessory muscles. CARDIOVASCULAR: Heart is regular without murmurs, gallops, or rubs. GI: The abdomen is soft, non-distended, non-tender.  RUQ percutaneous cholecystostomy drain place.  Luer lock from the stopcock was loose, so was tightened at bedside. Bilious drainage in bag. NEUROLOGIC:  Motor and sensation is grossly normal.  Cranial nerves are grossly intact. PSYCH:  Alert and oriented to person, place and time. Affect is normal.  Labs/Imaging: Labs from 09/09/23: Na 136, K 4.7, Cl 103, CO2 30.7, BUN 15, Cr 0.8.  Total bili 0.2, AST 13, ALT 16, Alk Phos 75.  Albumin 3.4.  WBC 12.1, Hgb 9.4, Hct 29.1, Plt 381  Assessment and Plan: This is a 74 y.o. female with acute cholecystitis, s/p percutaneous cholecystostomy drain.  --The patient is scheduled for robotic cholecystectomy on 09/28/23.  She has been cleared by Dr. Auston and Dr. Marea.  Her last dose of Plavix  would be today, stopping doses tomorrow until after surgery.  Discussed with her again the surgical plan for a robotic assisted cholecystectomy, and reviewed the surgery at length with her including the planned incisions, the risks of bleeding, infection, injury to surrounding structures, the use of ICG to better evaluate the biliary anatomy, that this would be an outpatient surgery, possibility of another drain, pain control, and she's willing proceed. --All of her questions have been answered.  I spent 20 minutes dedicated to the care of this patient on the date of this encounter to include pre-visit review of records, face-to-face time  with the patient discussing diagnosis and management, and any post-visit coordination of care.   [name], MD [facility]

## 2023-09-23 ENCOUNTER — Encounter: Payer: Self-pay | Admitting: Surgery

## 2023-09-23 DIAGNOSIS — K760 Fatty (change of) liver, not elsewhere classified: Secondary | ICD-10-CM

## 2023-09-23 NOTE — Progress Notes (Addendum)
 Perioperative / Anesthesia Services  Pre-Admission Testing Clinical Review / Pre-Operative Anesthesia Consult  Date: 09/23/23  PATIENT DEMOGRAPHICS: Name: Kaitlin Miller DOB: [birth year] MRN:   [id number]  Note: Available PAT nursing documentation and vital signs have been reviewed. Clinical nursing staff has updated patient's PMH/PSHx, current medication list, and drug allergies/intolerances to ensure complete and comprehensive history available to assist care teams in MDM as it pertains to the aforementioned surgical procedure and anticipated anesthetic course. Extensive review of available clinical information personally performed. [place] PM[facility] and PSHx updated with any diagnoses/procedures that  may have been inadvertently omitted during his intake with the pre-admission testing department's nursing staff.  PLANNED SURGICAL PROCEDURE(S):   Case: [id number] Date/Time: 09/28/23 0715   Procedure: CHOLECYSTECTOMY, ROBOT-ASSISTED, LAPAROSCOPIC - ICG   Anesthesia type: General   Diagnosis: Acute cholecystitis [K81.0]   Pre-op diagnosis: Acute cholecystitis   Location: [facility] OR ROOM 04 / [facility]   Surgeons: Desiderio Schanz, MD        CLINICAL DISCUSSION: Kaitlin Miller is a 73 y.o. female who is submitted for pre-surgical anesthesia review and clearance prior to her undergoing the above procedure. Patient is a Former Smoker (25 pack years). Pertinent PMH includes: CAD, diastolic dysfunction, BILATERAL carotid artery disease (s/p PTA of LICA), HTN, HLD, multiple thyroid nodules, GERD (on daily PPI), cholecystitis, IDA, remote uterine cancer, anxiety.  Patient is followed by cardiology Jodeen, MD). She was last seen in the cardiology clinic on 09/22/2023; notes reviewed. At the time of her clinic visit, patient doing well overall from a cardiovascular perspective. Patient with exertional dyspnea that was reported to be stable and at baseline.  Patient denied any chest  pain, PND, orthopnea, palpitations, significant peripheral edema, weakness, fatigue, vertiginous symptoms, or presyncope/syncope. Patient with a past medical history significant for cardiovascular diagnoses. Documented physical exam was grossly benign, providing no evidence of acute exacerbation and/or decompensation of the patient's known cardiovascular conditions.  Most recent TTE performed on 05/11/2023 revealed a normal left ventricular systolic function with an EF of >55%. There was mild LVH.  There were no regional wall motion abnormalities. Left ventricular diastolic Doppler parameters consistent with abnormal relaxation (G1DD). Right ventricular size and function normal with a TAPSE measuring 2.1 cm  (normal range >/= 1.6 cm).  RVSP = 28.0 mmHg.  There was trivial pulmonic and mild tricuspid valve regurgitation.  All transvalvular gradients were noted to be normal providing no evidence of hemodynamically significant valvular stenosis. Aorta normal in size with no evidence of ectasia or aneurysmal dilatation.  Most recent myocardial perfusion imaging study was performed on 05/11/2023 revealing a  normal left ventricular systolic function with a hyperdynamic LVEF of 68%. There were no regional wall motion abnormalities.  No artifact or left ventricular cavity size enlargement appreciated on review of imaging. SPECT images demonstrated a small in size, mild in severity, reversible perfusion defect in the apical septal wall and true apex suggestive of a small area of mild ischemia. TID ratio = 1.04.  Patient experienced no chest pain during the study.  There was 1 mm of horizontal ST depression noted in the inferior leads during exercise and into recovery.  Study determined to be intermediate risk.  Coronary CTA was performed on 05/20/2023 that demonstrated an Agatston coronary artery calcium  score of 83.5. This placed patient in the 60th percentile for age, sex, and race matched controls. Calcium   depositions noted to be isolated mainly in the mid LAD and LCx (25-49%) distributions.  Study demonstrated normal coronary origin with RIGHT dominance.  Given patient's CAD and history of carotid artery stenting, patient remains on daily DAPT therapy using ASA + comparable.  Patient reportedly compliant with therapy with no evidence reports of GI/GU related bleeding. Blood pressure well controlled at 126/70 mmHg on currently prescribed vasodilator (hydralazine ), and diuretic (HCTZ) therapies.  Patient is on rosuvastatin  for her HLD diagnosis and ASCVD prevention. Patient is not diabetic. She does not have an OSAH diagnosis. Patient is able to complete all of her  ADL/IADLs without cardiovascular limitation.  Per the DASI, patient is able to achieve at least 4 METS of physical activity without experiencing any significant degree of angina/anginal equivalent symptoms. No changes were made to her medication regimen during her visit with cardiology.  Patient scheduled to follow-up with outpatient cardiology in 6 months or sooner if needed.  Kaitlin Miller is scheduled for an elective CHOLECYSTECTOMY, ROBOT-ASSISTED, LAPAROSCOPIC on 09/28/2023 with Dr. Elnoria Plant, MD.  Given patient's past medical history significant for cardiovascular diagnoses, presurgical cardiac clearance was sought by the PAT team.  Additionally, clearances from both vascular surgery and medicine were requested by patient's attending surgeon.  Clearances were obtained as follows:  Per internal medicine Felton, MD), patient is optimized for planned surgical procedure and may proceed at an overall MODERATE risk of perioperative complications.  Per vascular surgery Earmon, MD), patient may proceed with surgery.  She has a LOW risk for complications.  Patient may hold her Plavix  for 5 days prior to surgery and restart the day after.  Per cardiology Jodeen, MD), this patient is optimized for surgery and may proceed with the planned  procedural course with a LOW risk of significant perioperative cardiovascular complications.  Again, this patient is on daily DAPT therapy.  She has been instructed on recommendations for holding her clopidogrel  for 5 days prior to her procedure with plans to restart since postoperatively respectively minimized by her primary attending surgeon.  The patient is aware that her last dose of clopidogrel  should be on 09/22/2023. Given that patient's past medical history is significant for cardiovascular diagnoses, including but not limited to CAD, general surgery has cleared patient to continue her daily low dose ASA throughout her perioperative course. She will be asked to hold her normal dose on the day of her procedure only. Patient has been updated on these directives from her specialty care providers by the PAT team.  Patient denies previous perioperative complications with anesthesia in the past. In review her EMR, it is noted that patient underwent a general anesthetic course here at [facility] (ASA III) in 08/2021 without documented complications.   MOST RECENT VITAL SIGNS:    09/22/2023    9:52 AM 09/21/2023   10:31 AM 08/18/2023    1:26 PM  Vitals with BMI  Height 5' 0 5' 0 5' 0  Weight 127 lbs 126 lbs 125 lbs 13 oz  BMI 24.8 24.61 24.57  Systolic 133  129  Diastolic 68  71  Pulse 82  80   PROVIDERS/SPECIALISTS: NOTE: Primary physician provider listed below. Patient may have been seen by APP or partner within same practice.   PROVIDER ROLE / SPECIALTY LAST SHERLEAN Plant Schanz, MD General Surgery (Surgeon) 09/22/2023  Auston Reyes BIRCH, MD Primary Care Provider 09/09/2023  Wilburn Fillers, MD Cardiology 06/01/2023  Marea Mayo, MD Vascular Surgery 06/30/2022  Elonda Nest, MD Physiatry 09/16/2022   ALLERGIES: No Known Allergies  CURRENT HOME MEDICATIONS: No current  facility-administered medications for this encounter.    aspirin  EC 81 MG EC  tablet   Cholecalciferol  (VITAMIN D ) 125 MCG (5000 UT) CAPS   clopidogrel  (PLAVIX ) 75 MG tablet   cyanocobalamin (VITAMIN B12) 1000 MCG/ML injection   ferrous sulfate 325 (65 FE) MG EC tablet   hydrALAZINE  (APRESOLINE ) 50 MG tablet   hydrochlorothiazide  (HYDRODIURIL ) 25 MG tablet   omeprazole (PRILOSEC) 20 MG capsule   potassium chloride  SA (KLOR-CON  M20) 20 MEQ tablet   rosuvastatin  (CRESTOR ) 10 MG tablet   sodium chloride  flush 0.9 % SOLN injection   venlafaxine  XR (EFFEXOR -XR) 150 MG 24 hr capsule   HISTORY: Past Medical History:  Diagnosis Date   Acid reflux    Anxiety    B12 deficiency    Benign essential hypertension    Bilateral carotid artery disease (HCC)    a.) s/p PTA and stenting LICA   CAD (coronary artery disease)    Cholecystitis 08/2023   Colon polyps    COPD (chronic obstructive pulmonary disease) (HCC)    HLD (hyperlipidemia)    IDA (iron deficiency anemia)    Long term current use of clopidogrel     Long-term use of aspirin  therapy    Multiple thyroid nodules    Osteopenia    Panlobular emphysema (HCC)    Tobacco abuse    Uterine cancer (HCC) 1980's   Vitamin D  deficiency    Past Surgical History:  Procedure Laterality Date   ABDOMINAL HYSTERECTOMY     CAROTID PTA/STENT INTERVENTION Left 05/12/2021   Procedure: CAROTID PTA/STENT INTERVENTION;  Surgeon: Marea Selinda RAMAN, MD;  Location: [facility] INVASIVE CV LAB;  Service: Cardiovascular;  Laterality: Left;   COLONOSCOPY WITH PROPOFOL  N/A 12/11/2020   Procedure: COLONOSCOPY WITH PROPOFOL ;  Surgeon: Toledo, Ladell POUR, MD;  Location: [facility] ENDOSCOPY;  Service: Gastroenterology;  Laterality: N/A;   ESOPHAGOGASTRODUODENOSCOPY (EGD) WITH PROPOFOL  N/A 09/17/2021   Procedure: ESOPHAGOGASTRODUODENOSCOPY (EGD) WITH PROPOFOL ;  Surgeon: Toledo, Ladell POUR, MD;  Location: [facility] ENDOSCOPY;  Service: Gastroenterology;  Laterality: N/A;   IR PERC CHOLECYSTOSTOMY  07/30/2023   LAPAROSCOPIC OVARIAN  1991   Family History  Problem  Relation Age of Onset   Breast cancer Sister 42   Social History   Tobacco Use   Smoking status: Former    Current packs/day: 0.50    Average packs/day: 0.5 packs/day for 50.0 years (25.0 ttl pk-yrs)    Types: Cigarettes   Smokeless tobacco: Never  Substance Use Topics   Alcohol use: Never   LABS:  Component Ref Range & Units 09/09/2023  WBC (White Blood Cell Count) 4.1 - 10.2 10^3/uL 12.1 High   RBC (Red Blood Cell Count) 4.04 - 5.48 10^6/uL 3.18 Low   Hemoglobin 12.0 - 15.0 gm/dL 9.4 Low   Hematocrit 64.9 - 47.0 % 29.1 Low   MCV (Mean Corpuscular Volume) 80.0 - 100.0 fl 91.5  MCH (Mean Corpuscular Hemoglobin) 27.0 - 31.2 pg 29.6  MCHC (Mean Corpuscular Hemoglobin Concentration) 32.0 - 36.0 gm/dL 67.6  Platelet Count 849 - 450 10^3/uL 381  RDW-CV (Red Cell Distribution Width) 11.6 - 14.8 % 13.3  MPV (Mean Platelet Volume) 9.4 - 12.4 fl 9.0 Low   Neutrophils 1.50 - 7.80 10^3/uL 8.04 High   Lymphocytes 1.00 - 3.60 10^3/uL 2.63  Monocytes 0.00 - 1.50 10^3/uL 1.11  Eosinophils 0.00 - 0.55 10^3/uL 0.19  Basophils 0.00 - 0.09 10^3/uL 0.08  Neutrophil % 32.0 - 70.0 % 66.4  Lymphocyte % 10.0 - 50.0 % 21.8  Monocyte % 4.0 -  13.0 % 9.2  Eosinophil % 1.0 - 5.0 % 1.6  Basophil% 0.0 - 2.0 % 0.7  Immature Granulocyte % <=0.7 % 0.3  Immature Granulocyte Count <=0.06 10^3/L 0.04    Component Ref Range & Units 09/09/2023  Glucose 70 - 110 mg/dL 66 Low   Sodium 863 - 854 mmol/L 136  Potassium 3.6 - 5.1 mmol/L 4.7  Chloride 97 - 109 mmol/L 103  Carbon Dioxide (CO2) 22.0 - 32.0 mmol/L 30.7  Urea Nitrogen (BUN) 7 - 25 mg/dL 15  Creatinine 0.6 - 1.1 mg/dL 0.8  Glomerular Filtration Rate (eGFR) >60 mL/min/1.73sq m 77  Calcium  8.7 - 10.3 mg/dL 9.1  AST 8 - 39 U/L 13  ALT 5 - 38 U/L 16  Alk Phos (alkaline Phosphatase) 34 - 104 U/L 75  Albumin 3.5 - 4.8 g/dL 3.4 Low   Bilirubin, Total 0.3 - 1.2 mg/dL 0.2 Low   Protein, Total 6.1 - 7.9 g/dL 5.7 Low    A/G Ratio 1.0 - 5.0 gm/dL 1.5    ECG: Date: 94/69/7974  Time ECG obtained: 0914 AM Rate: 81 bpm Rhythm: normal sinus Axis (leads I and aVF): normal Intervals: PR 160 ms. QRS 88 ms. QTc 476 ms. ST segment and T wave changes: No evidence of acute T wave abnormalities or significant ST segment elevation or depression.  Evidence of a possible, age undetermined, prior infarct:  Yes; anterior Comparison: Similar to previous tracing obtained on 04/23/2023   IMAGING / PROCEDURES: CT ABDOMEN PELVIS W CONTRAST performed on 07/30/2023 Mild distension of the gallbladder with a small volume of fluid adjacent to the gallbladder tip and adjacent to the liver. Recommend further evaluation by right upper quadrant ultrasound for more sensitive assessment of acute cholecystitis. A small volume of free fluid is present in the pelvis.  A moderate volume of stool material is present in the right and transverse colon which may be symptomatic.  US  ABDOMEN LIMITED RUQ (LIVER/GB) performed on 07/30/2023 Dilated gallbladder. No obvious stones. Borderline wall thickening. There is also a Murphy's sign reported. With this appearance and the prior CT scan would recommend further workup such HIDA scan to further delineate or MRCP. Fatty liver infiltration.   No ductal dilatation.  CT CORONARY MORPH W/CTA COR W/SCORE W/CA W/CM &/OR WO/CM performed on 05/20/2023 Coronary calcium  score of 83.5. This was 60th percentile for age and sex matched control. Normal coronary origin with right dominance. Mild LAD and LCx stenosis (25-49%). CAD-RADS 2. Mild non-obstructive CAD (25-49%). Consider non-atherosclerotic causes of chest pain. Consider preventive therapy and risk factor modification. Mild central lobar emphysema. No other significant extracardiac findings.   MYOCARDIAL PERFUSION IMAGING STUDY (LEXISCAN) performed on 05/11/2023 Normal left ventricular systolic function with a hyperdynamic LVEF of 68%. There were  no regional wall motion abnormalities No artifact or left ventricular cavity size enlargement. SPECT images demonstrated a small in size, mild in severity, reversible perfusion defect in the apical septal wall and true apex which can suggest small area of mild ischemia. TID ratio = 1.04 No chest pain during the study, however there was 1 mm of horizontal ST depression noted in the inferior leads during exercise/recovery. Overall, study determined to be intermediate risk.  TRANSTHORACIC ECHOCARDIOGRAM performed on 05/11/2023 Normal left ventricular systolic function with an EF of >55% Mild LVH No regional wall motion abnormalities Left ventricular diastolic Doppler parameters consistent with abnormal relaxation (G1DD). Normal right ventricular size and function Trivial PR Mild MR and TR Normal gradients; no valvular stenosis  CT ANGIO  NECK W/CM &/OR WO/CM performed on 04/24/2021 Plaque at the right ICA origin causes approximately 50% stenosis. Plaque at the proximal left ICA causes at least 70% stenosis. Emphysema.  IMPRESSION AND PLAN: Kaitlin Miller has been referred for pre-anesthesia review and clearance prior to her undergoing the planned anesthetic and procedural courses. Available labs, pertinent testing, and imaging results were personally reviewed by me in preparation for upcoming operative/procedural course. [facility] medical record has been updated following extensive record review and patient interview with PAT staff.   This patient has been appropriately cleared by cardiology (LOW), vascular surgery (LOW), and internal/family medicine (MODERATE) with the individually indicated risks of patient experiencing significant perioperative complications. Based on clinical review performed today (09/23/23), barring any significant acute changes in the patient's overall condition, it is anticipated that she will be able to proceed with the planned surgical intervention. Any acute  changes in clinical condition may necessitate her procedure being postponed and/or cancelled. Patient will meet with anesthesia team (MD and/or CRNA) on the day of her procedure for preoperative evaluation/assessment. Questions regarding anesthetic course will be fielded at that time.   Pre-surgical instructions were reviewed with the patient during his PAT appointment, and questions were fielded to satisfaction by PAT clinical staff. She has been instructed on which medications that she will need to hold prior to surgery, as well as the ones that have been deemed safe/appropriate to take on the day of her procedure. As part of the general education provided by PAT, patient made aware both verbally and in writing, that she would need to abstain from the use of any illegal substances during her perioperative course. She was advised that failure to follow the provided instructions could necessitate case cancellation or result in serious perioperative complications up to and including death. Patient encouraged to contact PAT and/or her surgeon's office to discuss any questions or concerns that may arise prior to surgery; verbalized understanding.   Dorise Pereyra, MSN, APRN, FNP-C, CEN [facility]  Perioperative Services Nurse Practitioner Phone: [phone] Fax: [phone] 09/23/23 1:25 PM  NOTE: This note has been prepared using Dragon dictation software. Despite my best ability to proofread, there is always the potential that unintentional transcriptional errors may still occur from this process.

## 2023-09-28 ENCOUNTER — Ambulatory Visit: Payer: Self-pay | Admitting: Urgent Care

## 2023-09-28 ENCOUNTER — Other Ambulatory Visit: Payer: Self-pay

## 2023-09-28 ENCOUNTER — Encounter: Payer: Self-pay | Admitting: Surgery

## 2023-09-28 ENCOUNTER — Ambulatory Visit
Admission: RE | Admit: 2023-09-28 | Discharge: 2023-09-28 | Disposition: A | Discharge status: Home / Self Care | Attending: Surgery | Admitting: Surgery

## 2023-09-28 ENCOUNTER — Encounter
Admission: RE | Disposition: A | Discharge status: Home / Self Care | Payer: Self-pay | Source: Home / Self Care | Attending: Surgery

## 2023-09-28 DIAGNOSIS — I10 Essential (primary) hypertension: Secondary | ICD-10-CM | POA: Insufficient documentation

## 2023-09-28 DIAGNOSIS — Z87891 Personal history of nicotine dependence: Secondary | ICD-10-CM | POA: Diagnosis not present

## 2023-09-28 DIAGNOSIS — Z7902 Long term (current) use of antithrombotics/antiplatelets: Secondary | ICD-10-CM | POA: Diagnosis not present

## 2023-09-28 DIAGNOSIS — E785 Hyperlipidemia, unspecified: Secondary | ICD-10-CM | POA: Diagnosis not present

## 2023-09-28 DIAGNOSIS — Z7951 Long term (current) use of inhaled steroids: Secondary | ICD-10-CM | POA: Insufficient documentation

## 2023-09-28 DIAGNOSIS — I2510 Atherosclerotic heart disease of native coronary artery without angina pectoris: Secondary | ICD-10-CM | POA: Diagnosis not present

## 2023-09-28 DIAGNOSIS — F419 Anxiety disorder, unspecified: Secondary | ICD-10-CM | POA: Diagnosis not present

## 2023-09-28 DIAGNOSIS — J449 Chronic obstructive pulmonary disease, unspecified: Secondary | ICD-10-CM | POA: Insufficient documentation

## 2023-09-28 DIAGNOSIS — Z7982 Long term (current) use of aspirin: Secondary | ICD-10-CM | POA: Diagnosis not present

## 2023-09-28 DIAGNOSIS — K8012 Calculus of gallbladder with acute and chronic cholecystitis without obstruction: Secondary | ICD-10-CM | POA: Diagnosis not present

## 2023-09-28 DIAGNOSIS — Z8542 Personal history of malignant neoplasm of other parts of uterus: Secondary | ICD-10-CM | POA: Insufficient documentation

## 2023-09-28 DIAGNOSIS — E041 Nontoxic single thyroid nodule: Secondary | ICD-10-CM | POA: Insufficient documentation

## 2023-09-28 DIAGNOSIS — Z79899 Other long term (current) drug therapy: Secondary | ICD-10-CM | POA: Diagnosis not present

## 2023-09-28 DIAGNOSIS — K219 Gastro-esophageal reflux disease without esophagitis: Secondary | ICD-10-CM | POA: Diagnosis not present

## 2023-09-28 DIAGNOSIS — K810 Acute cholecystitis: Secondary | ICD-10-CM | POA: Diagnosis not present

## 2023-09-28 DIAGNOSIS — K760 Fatty (change of) liver, not elsewhere classified: Secondary | ICD-10-CM

## 2023-09-28 HISTORY — DX: Gastro-esophageal reflux disease without esophagitis: K21.9

## 2023-09-28 HISTORY — DX: Fatty (change of) liver, not elsewhere classified: K76.0

## 2023-09-28 HISTORY — DX: Other ill-defined heart diseases: I51.89

## 2023-09-28 HISTORY — DX: Long term (current) use of aspirin: Z79.82

## 2023-09-28 HISTORY — DX: Tobacco use: Z72.0

## 2023-09-28 HISTORY — PX: INDOCYANINE GREEN FLUORESCENCE IMAGING (ICG): SHX7595

## 2023-09-28 HISTORY — DX: Long term (current) use of antithrombotics/antiplatelets: Z79.02

## 2023-09-28 SURGERY — CHOLECYSTECTOMY, ROBOT-ASSISTED, LAPAROSCOPIC
Anesthesia: General

## 2023-09-28 MED ORDER — FENTANYL CITRATE (PF) 100 MCG/2ML IJ SOLN
INTRAMUSCULAR | Status: DC | PRN
  Administered 2023-09-28: 50 ug via INTRAVENOUS
  Administered 2023-09-28 (×2): 25 ug via INTRAVENOUS

## 2023-09-28 MED ORDER — FENTANYL CITRATE (PF) 100 MCG/2ML IJ SOLN
INTRAMUSCULAR | Status: AC
  Filled 2023-09-28: qty 2

## 2023-09-28 MED ORDER — BUPIVACAINE-EPINEPHRINE (PF) 0.25% -1:200000 IJ SOLN
INTRAMUSCULAR | Status: DC | PRN
  Administered 2023-09-28: 50 mL

## 2023-09-28 MED ORDER — ROCURONIUM BROMIDE 10 MG/ML (PF) SYRINGE
PREFILLED_SYRINGE | INTRAVENOUS | Status: AC
Start: 2023-09-28 — End: 2023-09-28
  Filled 2023-09-28: qty 10

## 2023-09-28 MED ORDER — ORAL CARE MOUTH RINSE: 15.0000 mL | Freq: Once | OROMUCOSAL | Status: AC

## 2023-09-28 MED ORDER — ACETAMINOPHEN 500 MG PO TABS
ORAL_TABLET | ORAL | Status: AC
  Filled 2023-09-28: qty 2

## 2023-09-28 MED ORDER — 0.9 % SODIUM CHLORIDE (POUR BTL) OPTIME
TOPICAL | Status: DC | PRN
  Administered 2023-09-28: 500 mL

## 2023-09-28 MED ORDER — DEXAMETHASONE SODIUM PHOSPHATE 10 MG/ML IJ SOLN
INTRAMUSCULAR | Status: DC | PRN
  Administered 2023-09-28: 10 mg via INTRAVENOUS

## 2023-09-28 MED ORDER — LIDOCAINE HCL (PF) 2 % IJ SOLN
INTRAMUSCULAR | Status: AC
  Filled 2023-09-28: qty 5

## 2023-09-28 MED ORDER — CHLORHEXIDINE GLUCONATE CLOTH 2 % EX PADS: 6.0000 | MEDICATED_PAD | Freq: Once | CUTANEOUS | Status: DC

## 2023-09-28 MED ORDER — FENTANYL CITRATE (PF) 100 MCG/2ML IJ SOLN: 25.0000 ug | INTRAMUSCULAR | Status: DC | PRN

## 2023-09-28 MED ORDER — PHENYLEPHRINE 80 MCG/ML (10ML) SYRINGE FOR IV PUSH (FOR BLOOD PRESSURE SUPPORT)
PREFILLED_SYRINGE | INTRAVENOUS | Status: AC
  Filled 2023-09-28: qty 10

## 2023-09-28 MED ORDER — EPHEDRINE SULFATE-NACL 50-0.9 MG/10ML-% IV SOSY
PREFILLED_SYRINGE | INTRAVENOUS | Status: DC | PRN
  Administered 2023-09-28: 5 mg via INTRAVENOUS
  Administered 2023-09-28: 10 mg via INTRAVENOUS

## 2023-09-28 MED ORDER — EPHEDRINE 5 MG/ML INJ
INTRAVENOUS | Status: AC
  Filled 2023-09-28: qty 5

## 2023-09-28 MED ORDER — PHENYLEPHRINE 80 MCG/ML (10ML) SYRINGE FOR IV PUSH (FOR BLOOD PRESSURE SUPPORT)
PREFILLED_SYRINGE | INTRAVENOUS | Status: DC | PRN
  Administered 2023-09-28: 80 ug via INTRAVENOUS
  Administered 2023-09-28: 160 ug via INTRAVENOUS
  Administered 2023-09-28: 80 ug via INTRAVENOUS
  Administered 2023-09-28: 160 ug via INTRAVENOUS
  Administered 2023-09-28: 80 ug via INTRAVENOUS

## 2023-09-28 MED ORDER — SODIUM CHLORIDE 0.9 % IV SOLN
INTRAVENOUS | Status: AC
  Filled 2023-09-28: qty 2

## 2023-09-28 MED ORDER — LIDOCAINE HCL (CARDIAC) PF 100 MG/5ML IV SOSY
PREFILLED_SYRINGE | INTRAVENOUS | Status: DC | PRN
  Administered 2023-09-28: 100 mg via INTRAVENOUS

## 2023-09-28 MED ORDER — INDOCYANINE GREEN 25 MG IV SOLR
1.2500 mg | INTRAVENOUS | Status: AC
  Administered 2023-09-28: 1.25 mg via INTRAVENOUS

## 2023-09-28 MED ORDER — ACETAMINOPHEN 10 MG/ML IV SOLN: 1000.0000 mg | Freq: Once | INTRAVENOUS | Status: DC | PRN

## 2023-09-28 MED ORDER — OXYCODONE HCL 5 MG PO TABS
ORAL_TABLET | ORAL | Status: AC
  Filled 2023-09-28: qty 1

## 2023-09-28 MED ORDER — SUGAMMADEX SODIUM 200 MG/2ML IV SOLN
INTRAVENOUS | Status: DC | PRN
  Administered 2023-09-28: 200 mg via INTRAVENOUS

## 2023-09-28 MED ORDER — DROPERIDOL 2.5 MG/ML IJ SOLN: 0.6250 mg | Freq: Once | INTRAMUSCULAR | Status: DC | PRN

## 2023-09-28 MED ORDER — BUPIVACAINE LIPOSOME 1.3 % IJ SUSP
INTRAMUSCULAR | Status: AC
  Filled 2023-09-28: qty 20

## 2023-09-28 MED ORDER — ACETAMINOPHEN 500 MG PO TABS: 1000.0000 mg | ORAL_TABLET | Freq: Four times a day (QID) | ORAL | Status: AC | PRN

## 2023-09-28 MED ORDER — SODIUM CHLORIDE 0.9 % IV SOLN
2.0000 g | INTRAVENOUS | Status: AC
  Administered 2023-09-28: 2 g via INTRAVENOUS

## 2023-09-28 MED ORDER — GABAPENTIN 100 MG PO CAPS
ORAL_CAPSULE | ORAL | Status: AC
  Filled 2023-09-28: qty 2

## 2023-09-28 MED ORDER — CHLORHEXIDINE GLUCONATE CLOTH 2 % EX PADS
6.0000 | MEDICATED_PAD | Freq: Once | CUTANEOUS | Status: AC
  Administered 2023-09-28: 6 via TOPICAL

## 2023-09-28 MED ORDER — OXYCODONE HCL 5 MG PO TABS
5.0000 mg | ORAL_TABLET | Freq: Once | ORAL | Status: AC | PRN
  Administered 2023-09-28: 5 mg via ORAL

## 2023-09-28 MED ORDER — ONDANSETRON HCL 4 MG/2ML IJ SOLN
INTRAMUSCULAR | Status: DC | PRN
  Administered 2023-09-28: 4 mg via INTRAVENOUS

## 2023-09-28 MED ORDER — BUPIVACAINE LIPOSOME 1.3 % IJ SUSP: 10.0000 mL | Freq: Once | INTRAMUSCULAR | Status: DC

## 2023-09-28 MED ORDER — CHLORHEXIDINE GLUCONATE 0.12 % MT SOLN
OROMUCOSAL | Status: AC
  Filled 2023-09-28: qty 15

## 2023-09-28 MED ORDER — PROPOFOL 10 MG/ML IV BOLUS
INTRAVENOUS | Status: DC | PRN
Start: 2023-09-28 — End: 2023-09-28
  Administered 2023-09-28: 100 ug/kg/min via INTRAVENOUS

## 2023-09-28 MED ORDER — DEXAMETHASONE SODIUM PHOSPHATE 10 MG/ML IJ SOLN
INTRAMUSCULAR | Status: AC
  Filled 2023-09-28: qty 1

## 2023-09-28 MED ORDER — CHLORHEXIDINE GLUCONATE 0.12 % MT SOLN
15.0000 mL | Freq: Once | OROMUCOSAL | Status: AC
  Administered 2023-09-28: 15 mL via OROMUCOSAL

## 2023-09-28 MED ORDER — GABAPENTIN 100 MG PO CAPS
200.0000 mg | ORAL_CAPSULE | ORAL | Status: AC
  Administered 2023-09-28: 200 mg via ORAL

## 2023-09-28 MED ORDER — OXYCODONE HCL 5 MG PO TABS: 5.0000 mg | ORAL_TABLET | Freq: Four times a day (QID) | ORAL | 0 refills | Status: DC | PRN

## 2023-09-28 MED ORDER — ROCURONIUM BROMIDE 100 MG/10ML IV SOLN
INTRAVENOUS | Status: DC | PRN
  Administered 2023-09-28: 50 mg via INTRAVENOUS

## 2023-09-28 MED ORDER — ACETAMINOPHEN 500 MG PO TABS
1000.0000 mg | ORAL_TABLET | ORAL | Status: AC
  Administered 2023-09-28: 1000 mg via ORAL

## 2023-09-28 MED ORDER — OXYCODONE HCL 5 MG/5ML PO SOLN: 5.0000 mg | Freq: Once | ORAL | Status: AC | PRN

## 2023-09-28 MED ORDER — INDOCYANINE GREEN 25 MG IV SOLR
INTRAVENOUS | Status: AC
  Filled 2023-09-28: qty 10

## 2023-09-28 MED ORDER — ONDANSETRON HCL 4 MG/2ML IJ SOLN
INTRAMUSCULAR | Status: AC
  Filled 2023-09-28: qty 2

## 2023-09-28 MED ORDER — BUPIVACAINE-EPINEPHRINE (PF) 0.25% -1:200000 IJ SOLN
INTRAMUSCULAR | Status: AC
  Filled 2023-09-28: qty 30

## 2023-09-28 MED ORDER — LACTATED RINGERS IV SOLN: INTRAVENOUS | Status: DC

## 2023-09-28 MED ORDER — MIDAZOLAM HCL 2 MG/2ML IJ SOLN
INTRAMUSCULAR | Status: DC | PRN
  Administered 2023-09-28: 2 mg via INTRAVENOUS

## 2023-09-28 MED ORDER — MIDAZOLAM HCL 2 MG/2ML IJ SOLN
INTRAMUSCULAR | Status: AC
  Filled 2023-09-28: qty 2

## 2023-09-28 MED ORDER — PROPOFOL 1000 MG/100ML IV EMUL
INTRAVENOUS | Status: AC
  Filled 2023-09-28: qty 100

## 2023-09-28 SURGICAL SUPPLY — 35 items
BAG PRESSURE INF REUSE 1000 (BAG) IMPLANT
CANNULA CAP OBTURATR AIRSEAL 8 (CAP) IMPLANT
CAUTERY HOOK MNPLR 1.6 DVNC XI (INSTRUMENTS) ×2 IMPLANT
CLIP LIGATING HEMO O LOK GREEN (MISCELLANEOUS) ×2 IMPLANT
DEFOGGER SCOPE WARM SEASHARP (MISCELLANEOUS) ×2 IMPLANT
DERMABOND ADVANCED .7 DNX12 (GAUZE/BANDAGES/DRESSINGS) ×2 IMPLANT
DRAPE ARM DVNC X/XI (DISPOSABLE) ×8 IMPLANT
DRAPE COLUMN DVNC XI (DISPOSABLE) ×2 IMPLANT
ELECTRODE CAUTERY BLDE TIP 2.5 (TIP) ×2 IMPLANT
ELECTRODE REM PT RTRN 9FT ADLT (ELECTROSURGICAL) ×2 IMPLANT
FORCEPS BPLR R/ABLATION 8 DVNC (INSTRUMENTS) ×2 IMPLANT
FORCEPS PROGRASP DVNC XI (FORCEP) ×2 IMPLANT
GLOVE SURG SYN 7.0 PF PI (GLOVE) ×4 IMPLANT
GLOVE SURG SYN 7.5 PF PI (GLOVE) ×4 IMPLANT
GOWN STRL REUS W/ TWL LRG LVL3 (GOWN DISPOSABLE) ×8 IMPLANT
IRRIGATOR SUCT 8 DISP DVNC XI (IRRIGATION / IRRIGATOR) IMPLANT
IV NS 1000ML BAXH (IV SOLUTION) IMPLANT
KIT PINK PAD W/HEAD ARM REST (MISCELLANEOUS) ×2 IMPLANT
LABEL OR SOLS (LABEL) ×2 IMPLANT
MANIFOLD NEPTUNE II (INSTRUMENTS) ×2 IMPLANT
NDL HYPO 22X1.5 SAFETY MO (MISCELLANEOUS) ×2 IMPLANT
NEEDLE HYPO 22X1.5 SAFETY MO (MISCELLANEOUS) ×2 IMPLANT
NS IRRIG 500ML POUR BTL (IV SOLUTION) ×2 IMPLANT
OBTURATOR OPTICALSTD 8 DVNC (TROCAR) ×2 IMPLANT
PACK LAP CHOLECYSTECTOMY (MISCELLANEOUS) ×2 IMPLANT
SEAL UNIV 5-12 XI (MISCELLANEOUS) ×8 IMPLANT
SET TUBE FILTERED XL AIRSEAL (SET/KITS/TRAYS/PACK) IMPLANT
SET TUBE SMOKE EVAC HIGH FLOW (TUBING) ×2 IMPLANT
SOLUTION ELECTROSURG ANTI STCK (MISCELLANEOUS) ×2 IMPLANT
SPIKE FLUID TRANSFER (MISCELLANEOUS) ×2 IMPLANT
SUT MNCRL AB 4-0 PS2 18 (SUTURE) ×2 IMPLANT
SUT VIC AB 3-0 SH 27X BRD (SUTURE) IMPLANT
SUT VICRYL 0 UR6 27IN ABS (SUTURE) ×4 IMPLANT
SYSTEM BAG RETRIEVAL 10MM (BASKET) ×2 IMPLANT
WATER STERILE IRR 500ML POUR (IV SOLUTION) ×2 IMPLANT

## 2023-09-28 NOTE — Transfer of Care (Signed)
 Immediate Anesthesia Transfer of Care Note  Patient: Kaitlin Miller  Procedure(s) Performed: CHOLECYSTECTOMY, ROBOT-ASSISTED, LAPAROSCOPIC INDOCYANINE GREEN  FLUORESCENCE IMAGING (ICG)  Patient Location: PACU  Anesthesia Type:General  Level of Consciousness: drowsy  Airway & Oxygen Therapy: Patient Spontanous Breathing and Patient connected to face mask oxygen  Post-op Assessment: Report given to RN and Post -op Vital signs reviewed and stable  Post vital signs: Reviewed and stable  Last Vitals:  Vitals Value Taken Time  BP 137/58 09/28/23 09:09  Temp 36.2 C 09/28/23 09:09  Pulse 73 09/28/23 09:12  Resp 12 09/28/23 09:12  SpO2 100 % 09/28/23 09:12  Vitals shown include unfiled device data.  Last Pain:  Vitals:   09/28/23 0909  TempSrc:   PainSc: Asleep         Complications: There were no known notable events for this encounter.

## 2023-09-28 NOTE — Interval H&P Note (Signed)
 History and Physical Interval Note:  09/28/2023 7:02 AM  Kaitlin Miller  has presented today for surgery, with the diagnosis of Acute cholecystitis.  The various methods of treatment have been discussed with the patient and family. After consideration of risks, benefits and other options for treatment, the patient has consented to  Procedure(s) with comments: CHOLECYSTECTOMY, ROBOT-ASSISTED, LAPAROSCOPIC (N/A) - ICG as a surgical intervention.  The patient's history has been reviewed, patient examined, no change in status, stable for surgery.  I have reviewed the patient's chart and labs.  Questions were answered to the patient's satisfaction.     Deedra Pro

## 2023-09-28 NOTE — Anesthesia Postprocedure Evaluation (Signed)
 Anesthesia Post Note  Patient: Kaitlin Miller  Procedure(s) Performed: CHOLECYSTECTOMY, ROBOT-ASSISTED, LAPAROSCOPIC INDOCYANINE GREEN  FLUORESCENCE IMAGING (ICG)  Patient location during evaluation: PACU Anesthesia Type: General Level of consciousness: awake and alert Pain management: pain level controlled Vital Signs Assessment: post-procedure vital signs reviewed and stable Respiratory status: spontaneous breathing, nonlabored ventilation and respiratory function stable Cardiovascular status: blood pressure returned to baseline and stable Postop Assessment: no apparent nausea or vomiting Anesthetic complications: no   There were no known notable events for this encounter.   Last Vitals:  Vitals:   09/28/23 0945 09/28/23 1000  BP:  (!) 123/59  Pulse:  72  Resp:  15  Temp:  36.6 C  SpO2: 92% 92%    Last Pain:  Vitals:   09/28/23 1000  TempSrc:   PainSc: 0-No pain                 Camellia Merilee Louder

## 2023-09-28 NOTE — Anesthesia Preprocedure Evaluation (Signed)
 Anesthesia Evaluation  Patient identified by MRN, date of birth, ID band Patient awake    Reviewed: Allergy & Precautions, H&P , NPO status , Patient's Chart, lab work & pertinent test results, reviewed documented beta blocker date and time   Airway Mallampati: II  TM Distance: >3 FB Neck ROM: full    Dental  (+) Teeth Intact   Pulmonary COPD,  COPD inhaler, former smoker   Pulmonary exam normal        Cardiovascular Exercise Tolerance: Good hypertension, On Medications + CAD  Normal cardiovascular exam Rhythm:regular Rate:Normal     Neuro/Psych   Anxiety     negative neurological ROS  negative psych ROS   GI/Hepatic Neg liver ROS,GERD  Medicated,,  Endo/Other  negative endocrine ROS    Renal/GU negative Renal ROS  negative genitourinary   Musculoskeletal   Abdominal   Peds  Hematology  (+) Blood dyscrasia, anemia   Anesthesia Other Findings Past Medical History: No date: Anxiety No date: B12 deficiency No date: Benign essential hypertension No date: Bilateral carotid artery disease (HCC)     Comment:  a.) s/p PTA and stenting LICA No date: CAD (coronary artery disease) 08/2023: Cholecystitis No date: Colon polyps No date: COPD (chronic obstructive pulmonary disease) (HCC) No date: Diastolic dysfunction No date: GERD (gastroesophageal reflux disease) No date: Hepatic steatosis No date: HLD (hyperlipidemia) No date: IDA (iron deficiency anemia) No date: Long term current use of clopidogrel  No date: Long-term use of aspirin  therapy No date: Multiple thyroid nodules No date: Osteopenia No date: Panlobular emphysema (HCC) No date: Tobacco abuse 1980's: Uterine cancer (HCC) No date: Vitamin D  deficiency Past Surgical History: No date: ABDOMINAL HYSTERECTOMY 05/12/2021: CAROTID PTA/STENT INTERVENTION; Left     Comment:  Procedure: CAROTID PTA/STENT INTERVENTION;  Surgeon:               Marea Selinda RAMAN, MD;   Location: [facility] INVASIVE CV LAB;                Service: Cardiovascular;  Laterality: Left; 12/11/2020: COLONOSCOPY WITH PROPOFOL ; N/A     Comment:  Procedure: COLONOSCOPY WITH PROPOFOL ;  Surgeon: Toledo,               Ladell POUR, MD;  Location: [facility] ENDOSCOPY;  Service:               Gastroenterology;  Laterality: N/A; 09/17/2021: ESOPHAGOGASTRODUODENOSCOPY (EGD) WITH PROPOFOL ; N/A     Comment:  Procedure: ESOPHAGOGASTRODUODENOSCOPY (EGD) WITH               PROPOFOL ;  Surgeon: Toledo, Ladell POUR, MD;  Location:               [facility] ENDOSCOPY;  Service: Gastroenterology;  Laterality:               N/A; 07/30/2023: IR PERC CHOLECYSTOSTOMY 1991: LAPAROSCOPIC OVARIAN BMI    Body Mass Index: 24.80 kg/m     Reproductive/Obstetrics negative OB ROS                              Anesthesia Physical Anesthesia Plan  ASA: 3  Anesthesia Plan: General ETT   Post-op Pain Management:    Induction:   PONV Risk Score and Plan: 4 or greater  Airway Management Planned:   Additional Equipment:   Intra-op Plan:   Post-operative Plan:   Informed Consent: I have reviewed the patients History and Physical, chart, labs and discussed  the procedure including the risks, benefits and alternatives for the proposed anesthesia with the patient or authorized representative who has indicated his/her understanding and acceptance.     Dental Advisory Given  Plan Discussed with: CRNA  Anesthesia Plan Comments:         Anesthesia Quick Evaluation

## 2023-09-28 NOTE — Op Note (Signed)
  Procedure Date:  09/28/2023  Pre-operative Diagnosis:  Acute cholecystitis  Post-operative Diagnosis: Acute cholecystitis  Procedure:  Robotic assisted cholecystectomy with ICG FireFly cholangiogram  Surgeon:  [name], MD  Anesthesia:  General endotracheal  Estimated Blood Loss:  10 ml  Specimens:  gallbladder  Complications:  None  Indications for Procedure:  This is a 73 y.o. female with a history of acute cholecystitis, s/p percutaneous cholecystostomy drain placement.  She now presents for interval cholecystectomy.  The benefits, complications, treatment options, and expected outcomes were discussed with the patient. The risks of bleeding, infection, recurrence of symptoms, failure to resolve symptoms, bile duct damage, bile duct leak, retained common bile duct stone, bowel injury, and need for further procedures were all discussed with the patient and she was willing to proceed.  Description of Procedure: The patient was correctly identified in the preoperative area and brought into the operating room.  The patient was placed supine with VTE prophylaxis in place.  Appropriate time-outs were performed.  Anesthesia was induced and the patient was intubated.  Appropriate antibiotics were infused.  The abdomen was prepped and draped in a sterile fashion. An infraumbilical incision was made. A cutdown technique was used to enter the abdominal cavity without injury, and a 12 mm robotic port was inserted.  Pneumoperitoneum was obtained with appropriate opening pressures.  Three 8-mm ports were placed in the mid abdomen at the level of the umbilicus under direct visualization.  The DaVinci platform was docked, camera targeted, and instruments were placed under direct visualization.  The gallbladder was identified.  The fundus was grasped and retracted cephalad.  The drain was pulled and the scar tract was cauterized and divided.  Adhesions were lysed bluntly and with electrocautery.  The infundibulum was grasped and retracted laterally, exposing the peritoneum overlying the gallbladder.  This was incised with electrocautery and extended on either side of the gallbladder.  FireFly cholangiogram was then obtained, and we were able to clearly identify the cystic duct and common bile duct.  The cystic duct and cystic artery were carefully dissected with combination of cautery and blunt dissection.  Both were clipped twice proximally and once distally, cutting in between.  The gallbladder was taken from the gallbladder fossa in a retrograde fashion with electrocautery. The gallbladder was placed in an Endocatch bag. The liver bed was inspected and any bleeding was controlled with electrocautery. The right upper quadrant was then inspected again revealing intact clips, no bleeding, and no ductal injury.    The 8 mm ports were removed under direct visualization and the 12 mm port was removed.  The Endocatch bag was brought out via the umbilical incision. The fascial opening was closed using 0 vicryl suture.  Local anesthetic was infused in all incisions and the incisions were closed with 4-0 Monocryl.  The wounds were cleaned and sealed with DermaBond.  The patient was emerged from anesthesia and extubated and brought to the recovery room for further management.  The patient tolerated the procedure well and all counts were correct at the end of the case.   [name], MD

## 2023-09-28 NOTE — Discharge Instructions (Signed)
 Discharge Instructions: 1.  Patient may shower, but do not scrub wounds heavily and dab dry only. 2.  Do not submerge wounds in pool/tub until fully healed. 3.  Do not apply ointments or hydrogen peroxide to the wounds. 4.  May apply ice packs to the wounds for comfort. 5.  Please change Band-Aid once daily until the drain site is fully healed. 6.  May resume your Plavix  on 09/30/2023 7.  Do not drive while taking narcotics for pain control.  Prior to driving, make sure you are able to rotate right and left to look at blindspots without significant pain or discomfort. 8.  No heavy lifting or pushing of more than 10-15 lbs for 4 weeks.

## 2023-09-28 NOTE — Anesthesia Procedure Notes (Signed)
 Procedure Name: Intubation Date/Time: 09/28/2023 7:43 AM  Performed by: Bonnetta Jimmey SAUNDERS, CRNAPre-anesthesia Checklist: Patient identified, Emergency Drugs available, Suction available and Patient being monitored Patient Re-evaluated:Patient Re-evaluated prior to induction Oxygen Delivery Method: Circle system utilized Preoxygenation: Pre-oxygenation with 100% oxygen Induction Type: IV induction Ventilation: Mask ventilation without difficulty Laryngoscope Size: Mac and 3 Grade View: Grade I Tube type: Oral Tube size: 6.5 mm Number of attempts: 1 Airway Equipment and Method: Stylet and Oral airway Placement Confirmation: ETT inserted through vocal cords under direct vision, positive ETCO2 and breath sounds checked- equal and bilateral Secured at: 20 cm Tube secured with: Tape Dental Injury: Teeth and Oropharynx as per pre-operative assessment

## 2023-09-29 ENCOUNTER — Encounter: Payer: Self-pay | Admitting: Surgery

## 2023-09-29 LAB — SURGICAL PATHOLOGY

## 2023-10-11 ENCOUNTER — Encounter: Payer: Self-pay | Admitting: Surgery

## 2023-10-11 ENCOUNTER — Ambulatory Visit (INDEPENDENT_AMBULATORY_CARE_PROVIDER_SITE_OTHER): Admitting: Surgery

## 2023-10-11 VITALS — BP 145/68 | HR 90 | Temp 97.7°F | Ht 60.0 in | Wt 125.6 lb

## 2023-10-11 DIAGNOSIS — K810 Acute cholecystitis: Secondary | ICD-10-CM

## 2023-10-11 DIAGNOSIS — Z09 Encounter for follow-up examination after completed treatment for conditions other than malignant neoplasm: Secondary | ICD-10-CM

## 2023-10-11 NOTE — Patient Instructions (Signed)

## 2023-10-11 NOTE — Progress Notes (Signed)
 10/11/2023  HPI: Kaitlin Miller is a 73 y.o. female s/p robotic assisted cholecystectomy on 09/28/2023.  Patient presents today for follow-up.  She reports that she has been doing well.  Reports some initial soreness but this has been improving.  Denies any nausea or vomiting or any issues with p.o. intake.  She has been trying to limit some of her p.o. intake to a low-fat diet.  Vital signs: BP (!) 145/68   Pulse 90   Temp 97.7 F (36.5 C) (Oral)   Ht 5' (1.524 m)   Wt 125 lb 9.6 oz (57 kg)   SpO2 97%   BMI 24.53 kg/m    Physical Exam: Constitutional: No acute distress Abdomen: Soft, nondistended, mildly sore to palpation.  Incisions are healing well and are clean, dry, intact.  Dermabond is peeling off already in different places.  Prior drain site is healed.  Assessment/Plan: This is a 73 y.o. female s/p robotic assisted cholecystectomy.  - Discussed with the patient the pathology findings showing chronic cholecystitis and cholelithiasis.  There was a benign lymph node excised as well with the gallbladder. - Patient is otherwise healing well.  Discussed with her that she can start liberalizing her diet.  There may be certain foods that she might not tolerate as easily but for the most part patients recover without any troubles with p.o. intake. - Discussed with her activity restrictions. - Follow-up as needed.   [name], MD [facility]

## 2023-11-19 ENCOUNTER — Emergency Department

## 2023-11-19 ENCOUNTER — Other Ambulatory Visit: Payer: Self-pay

## 2023-11-19 ENCOUNTER — Emergency Department
Admission: EM | Admit: 2023-11-19 | Discharge: 2023-11-19 | Disposition: A | Discharge status: Home / Self Care | Source: Ambulatory Visit

## 2023-11-19 DIAGNOSIS — S199XXA Unspecified injury of neck, initial encounter: Secondary | ICD-10-CM | POA: Diagnosis present

## 2023-11-19 DIAGNOSIS — I2510 Atherosclerotic heart disease of native coronary artery without angina pectoris: Secondary | ICD-10-CM | POA: Insufficient documentation

## 2023-11-19 DIAGNOSIS — J449 Chronic obstructive pulmonary disease, unspecified: Secondary | ICD-10-CM | POA: Diagnosis not present

## 2023-11-19 DIAGNOSIS — S161XXA Strain of muscle, fascia and tendon at neck level, initial encounter: Secondary | ICD-10-CM | POA: Insufficient documentation

## 2023-11-19 DIAGNOSIS — S0990XA Unspecified injury of head, initial encounter: Secondary | ICD-10-CM | POA: Diagnosis not present

## 2023-11-19 DIAGNOSIS — Y92410 Unspecified street and highway as the place of occurrence of the external cause: Secondary | ICD-10-CM | POA: Insufficient documentation

## 2023-11-19 DIAGNOSIS — S39012A Strain of muscle, fascia and tendon of lower back, initial encounter: Secondary | ICD-10-CM | POA: Insufficient documentation

## 2023-11-19 MED ORDER — LIDOCAINE 5 % EX PTCH
1.0000 | MEDICATED_PATCH | CUTANEOUS | Status: DC
  Administered 2023-11-19: 1 via TRANSDERMAL
  Filled 2023-11-19: qty 1
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
## 2023-12-03 ENCOUNTER — Other Ambulatory Visit (INDEPENDENT_AMBULATORY_CARE_PROVIDER_SITE_OTHER): Payer: Self-pay | Admitting: Vascular Surgery

## 2023-12-03 DIAGNOSIS — I6523 Occlusion and stenosis of bilateral carotid arteries: Secondary | ICD-10-CM

## 2023-12-07 ENCOUNTER — Encounter (INDEPENDENT_AMBULATORY_CARE_PROVIDER_SITE_OTHER): Payer: Self-pay | Admitting: Vascular Surgery

## 2023-12-07 ENCOUNTER — Ambulatory Visit (INDEPENDENT_AMBULATORY_CARE_PROVIDER_SITE_OTHER): Payer: Medicare HMO | Admitting: Vascular Surgery

## 2023-12-07 ENCOUNTER — Ambulatory Visit (INDEPENDENT_AMBULATORY_CARE_PROVIDER_SITE_OTHER): Payer: Medicare HMO

## 2023-12-07 VITALS — BP 148/65 | HR 81 | Resp 18 | Wt 128.8 lb

## 2023-12-07 DIAGNOSIS — I10 Essential (primary) hypertension: Secondary | ICD-10-CM

## 2023-12-07 DIAGNOSIS — I6523 Occlusion and stenosis of bilateral carotid arteries: Secondary | ICD-10-CM

## 2023-12-07 DIAGNOSIS — M79605 Pain in left leg: Secondary | ICD-10-CM

## 2023-12-07 DIAGNOSIS — E782 Mixed hyperlipidemia: Secondary | ICD-10-CM
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
## 2023-12-07 NOTE — Assessment & Plan Note (Signed)
 Her carotid duplex today shows a widely patent left carotid stent without significant recurrent stenosis.  Her velocities in the right carotid system have increased and now fall just into the 60 to 79% range on the right having previously been in the 40 to 59% range. She is on appropriate medical therapy with dual antiplatelet therapy and a statin agent.  Is asymptomatic.  At this time, she has not progressed enough to warrant intervention, but a shortened interval follow-up will be planned.  I will see her back in 6 months.

## 2024-01-02 ENCOUNTER — Other Ambulatory Visit (INDEPENDENT_AMBULATORY_CARE_PROVIDER_SITE_OTHER): Payer: Self-pay | Admitting: Vascular Surgery

## 2024-06-06 ENCOUNTER — Encounter (INDEPENDENT_AMBULATORY_CARE_PROVIDER_SITE_OTHER)

## 2024-06-06 ENCOUNTER — Ambulatory Visit (INDEPENDENT_AMBULATORY_CARE_PROVIDER_SITE_OTHER): Admitting: Vascular Surgery
